# Patient Record
Sex: Female | Born: 1950 | Race: White | Hispanic: No | Smoking: Current every day smoker
Health system: Southern US, Community
[De-identification: ages and names within clinical notes are randomized; demographics above are authoritative.]

## PROBLEM LIST (undated history)

## (undated) DIAGNOSIS — R51 Headache: Secondary | ICD-10-CM

## (undated) DIAGNOSIS — I10 Essential (primary) hypertension: Secondary | ICD-10-CM

## (undated) DIAGNOSIS — Z8489 Family history of other specified conditions: Secondary | ICD-10-CM

## (undated) DIAGNOSIS — R519 Headache, unspecified: Secondary | ICD-10-CM

## (undated) DIAGNOSIS — E119 Type 2 diabetes mellitus without complications: Secondary | ICD-10-CM

## (undated) DIAGNOSIS — E785 Hyperlipidemia, unspecified: Secondary | ICD-10-CM

## (undated) HISTORY — DX: Essential (primary) hypertension: I10

## (undated) HISTORY — DX: Type 2 diabetes mellitus without complications: E11.9

## (undated) HISTORY — PX: TONSILLECTOMY: SUR1361

## (undated) HISTORY — PX: WISDOM TOOTH EXTRACTION: SHX21

## (undated) HISTORY — DX: Hyperlipidemia, unspecified: E78.5

## (undated) HISTORY — PX: APPENDECTOMY: SHX54

## (undated) HISTORY — PX: TONSILECTOMY, ADENOIDECTOMY, BILATERAL MYRINGOTOMY AND TUBES: SHX2538

---

## 2012-10-27 ENCOUNTER — Ambulatory Visit: Payer: Self-pay

## 2014-03-09 DIAGNOSIS — E119 Type 2 diabetes mellitus without complications: Secondary | ICD-10-CM | POA: Insufficient documentation

## 2014-03-09 DIAGNOSIS — I10 Essential (primary) hypertension: Secondary | ICD-10-CM | POA: Insufficient documentation

## 2014-03-09 DIAGNOSIS — E7800 Pure hypercholesterolemia, unspecified: Secondary | ICD-10-CM | POA: Insufficient documentation

## 2014-06-15 DIAGNOSIS — M545 Low back pain, unspecified: Secondary | ICD-10-CM | POA: Insufficient documentation

## 2015-06-21 DIAGNOSIS — E119 Type 2 diabetes mellitus without complications: Secondary | ICD-10-CM | POA: Insufficient documentation

## 2016-08-26 ENCOUNTER — Other Ambulatory Visit: Payer: Self-pay

## 2016-08-27 ENCOUNTER — Other Ambulatory Visit
Admission: RE | Admit: 2016-08-27 | Discharge: 2016-08-27 | Disposition: A | Discharge status: Home / Self Care | Payer: BLUE CROSS/BLUE SHIELD | Source: Ambulatory Visit | Attending: General Surgery | Admitting: General Surgery

## 2016-08-27 ENCOUNTER — Ambulatory Visit (INDEPENDENT_AMBULATORY_CARE_PROVIDER_SITE_OTHER): Payer: BLUE CROSS/BLUE SHIELD | Admitting: General Surgery

## 2016-08-27 ENCOUNTER — Encounter: Payer: Self-pay | Admitting: General Surgery

## 2016-08-27 ENCOUNTER — Telehealth: Payer: Self-pay | Admitting: General Surgery

## 2016-08-27 VITALS — BP 164/88 | HR 86 | Temp 98.0°F | Ht 65.0 in | Wt 206.0 lb

## 2016-08-27 DIAGNOSIS — R221 Localized swelling, mass and lump, neck: Secondary | ICD-10-CM | POA: Diagnosis present

## 2016-08-27 LAB — BASIC METABOLIC PANEL
Anion gap: 10 (ref 5–15)
BUN: 10 mg/dL (ref 6–20)
CALCIUM: 9.6 mg/dL (ref 8.9–10.3)
CO2: 24 mmol/L (ref 22–32)
CREATININE: 0.67 mg/dL (ref 0.44–1.00)
Chloride: 95 mmol/L — ABNORMAL LOW (ref 101–111)
GFR calc non Af Amer: >60 mL/min (ref >60)
GLUCOSE: 147 mg/dL — AB (ref 65–99)
Potassium: 4.1 mmol/L (ref 3.5–5.1)
Sodium: 129 mmol/L — ABNORMAL LOW (ref 135–145)

## 2016-08-27 NOTE — Patient Instructions (Signed)
We need for you to go to the lab on 08/31/16 at 1:00 pm. Your CT scan is scheduled on 08/31/16 at 1:15 pm At [facility] office. Please do not have any thing by mouth 4 hours prior to this CT scan. Please call our office if you have any questions. We will send a referral to Dr. Charolett Bumpers office and someone will contact you from there office.

## 2016-08-27 NOTE — Progress Notes (Signed)
Patient ID: Angela Ballard, female   DOB: 09/04/1951, 65 y.o.   MRN: [id number]  CC: Neck Mass  HPI Angela Ballard is a 65 y.o. female who presents to clinic today for evaluation of a left neck mass. It was thought by her primary care provider to be a lymph node but has persisted for many months so a surgical consultation was requested. Patient reports that she thinks the area might be a little bit smaller than it was at its peak but it persists. Is been there at least 2 months. She denies any pain to the area. She denies any other actual symptoms. She denies any fevers, chills, nausea, vomiting, chest pain, shortness of breath, diarrhea, constipation. She also denies any pain with swallowing or any hoarseness. The vast majority of her teeth or guarding removed secondary to dental infections but she states she's had no new problems or difficulty with her dentures.  HPI  Past Medical History:  Diagnosis Date  . Diabetes mellitus without complication ([place])   . Hyperlipidemia   . Hypertension     Past Surgical History:  Procedure Laterality Date  . APPENDECTOMY    . TONSILECTOMY, ADENOIDECTOMY, BILATERAL MYRINGOTOMY AND TUBES  child  . WISDOM TOOTH EXTRACTION  teenager    Family History  Problem Relation Age of Onset  . Breast cancer Mother   . Peripheral Artery Disease Sister   . Epilepsy Brother     Social History Social History  Substance Use Topics  . Smoking status: Current Every Day Smoker    Packs/day: 1.00    Years: 40.00  . Smokeless tobacco: Never Used  . Alcohol use Yes     Comment: 1 beer daily    Allergies  Allergen Reactions  . Pravastatin Other (See Comments)  . Penicillins Rash    Current Outpatient Prescriptions  Medication Sig Dispense Refill  . Barberry-Oreg Grape-Goldenseal (BERBERINE COMPLEX) 200-200-50 MG CAPS Take by mouth.    . Biotin 1 MG CAPS Take by mouth.    . Cinnamon 500 MG capsule Take by mouth.    . Garlic 10 MG CAPS Take by mouth.     Marland Kitchen lisinopril-hydrochlorothiazide (PRINZIDE,ZESTORETIC) 10-12.5 MG tablet Take by mouth.    . metFORMIN (GLUCOPHAGE) 500 MG tablet Take by mouth.    . Multiple Vitamin (MULTI-VITAMINS) TABS Take by mouth.    . Omega-3 Fatty Acids (FISH OIL) 1000 MG CAPS Take by mouth.    . Red Yeast Rice 500 MG/0.5GM POWD Take by mouth.     No current facility-administered medications for this visit.      Review of Systems A Multi-point review of systems was asked and was negative except for the findings documented in the history of present illness   Physical Exam Blood pressure (!) 164/88, pulse 86, temperature 98 F (36.7 C), temperature source Oral, height 5\' 5"  (1.651 m), weight 93.4 kg (206 lb). CONSTITUTIONAL: No acute distress. EYES: Pupils are equal, round, and reactive to light, Sclera are non-icteric. EARS, NOSE, MOUTH AND THROAT: The oropharynx is clear. The oral mucosa is pink and moist. Hearing is intact to voice. Patient only has one natural tooth remaining remainder teeth are dentures. There is no palpable lesion within the mouth or along the gumline. NECK: Upon palpation of the patient's neck there is a 2 x 3 cm soft tissue mass, coming off of the angle of the mandible on the left. It is soft and partially mobile but there is no obvious delineation between the  soft tissue mass and the bottom of the parotid gland. There is no obvious lesion anywhere else on the neck and there are no obvious lymph nodes separate from this mass. RESPIRATORY:  Lungs are clear. There is normal respiratory effort, with equal breath sounds bilaterally, and without pathologic use of accessory muscles. CARDIOVASCULAR: Heart is regular without murmurs, gallops, or rubs. GI: The abdomen is soft, nontender, and nondistended. There are no palpable masses. There is no hepatosplenomegaly. There are normal bowel sounds in all quadrants. GU: Rectal deferred.   MUSCULOSKELETAL: Normal muscle strength and tone. No cyanosis or  edema.   SKIN: Turgor is good and there are no pathologic skin lesions or ulcers. NEUROLOGIC: Motor and sensation is grossly normal. Cranial nerves are grossly intact. PSYCH:  Oriented to person, place and time. Affect is normal.  Data Reviewed There are no images were labs to review I have personally reviewed the patient's imaging, laboratory findings and medical records.    Assessment    Left neck mass    Plan    65 year old female with a significant smoking history presents with a nontender left neck mass. Patient thinks that this may be related to an infection but there are no other signs of an infection. Given its size and location discussed with patient that it may be coming off of her parotid gland or it might be a lymph node. Discussed that we would obtain a CT scan of the head and neck as an outpatient and that I would have her follow up with an ENT surgeon for further evaluation. Discussed that should this be a problem that require surgery she be better suited with a surgeon who operates in the neck routinely. Outpatient orders for CT scan and referral to ENT were placed today. She'll follow-up in the general surgery clinic on an as-needed basis.     Time spent with the patient was 45 minutes, with more than 50% of the time spent in face-to-face education, counseling and care coordination.     Clayburn Pert, MD FACS General Surgeon 08/27/2016, 10:21 AM

## 2016-08-27 NOTE — Telephone Encounter (Signed)
Patient was referred to Dr Kathyrn Sheriff ENT. Appointment has been made. Patient has been advised of appointment information below.   09/07/16 @ 1:45--arrive 15 minutes early. [place] Location [address] [place] [address]  All clinic notes have been faxed to [phone].

## 2016-08-31 ENCOUNTER — Ambulatory Visit
Admission: RE | Admit: 2016-08-31 | Discharge: 2016-08-31 | Disposition: A | Discharge status: Home / Self Care | Payer: BLUE CROSS/BLUE SHIELD | Source: Ambulatory Visit | Attending: General Surgery | Admitting: General Surgery

## 2016-08-31 ENCOUNTER — Telehealth: Payer: Self-pay | Admitting: General Surgery

## 2016-08-31 ENCOUNTER — Encounter: Payer: Self-pay | Admitting: Radiology

## 2016-08-31 DIAGNOSIS — K119 Disease of salivary gland, unspecified: Secondary | ICD-10-CM | POA: Diagnosis not present

## 2016-08-31 DIAGNOSIS — R221 Localized swelling, mass and lump, neck: Secondary | ICD-10-CM | POA: Diagnosis present

## 2016-08-31 DIAGNOSIS — G319 Degenerative disease of nervous system, unspecified: Secondary | ICD-10-CM | POA: Diagnosis not present

## 2016-08-31 MED ORDER — IOPAMIDOL (ISOVUE-370) INJECTION 76%
75.0000 mL | Freq: Once | INTRAVENOUS | Status: AC | PRN
  Administered 2016-08-31: 75 mL via INTRAVENOUS

## 2016-08-31 NOTE — Telephone Encounter (Signed)
Patient would like to know results of CT scan from today. Report has not been read by radiologist at this time, how ever I told her I would check tomorrow and call her with the results. Patient has a follow up appointment with Dr.Juengal on 09/07/16.

## 2016-08-31 NOTE — Telephone Encounter (Signed)
Patient stated that a nurse here told her to call the office after she had her scan today. She wasn't sure why

## 2016-09-01 NOTE — Telephone Encounter (Signed)
LVM for patient to call office regarding Ct results.

## 2016-09-02 NOTE — Telephone Encounter (Signed)
LVM for patient to call office at this time.  CT scan faxed to [place] office at this time. Patient has an appointment on 09/07/16

## 2016-09-02 NOTE — Telephone Encounter (Signed)
Patient called to find out the results of her CT scan.

## 2016-09-03 NOTE — Telephone Encounter (Signed)
Patient has called and would like you to give her a call on her work phone @ [phone]. She would like to discuss her CT results. If patient does not answer, she would like you to leave a message on her voicemail and she will call back. She will be in and out of  Meetings today.

## 2016-09-03 NOTE — Telephone Encounter (Signed)
Spoke with patient at this time to discuss results of Ct scan. Patient has an appointment with ENT 09/07/16. Patient verbalized understanding.

## 2016-09-10 NOTE — Telephone Encounter (Signed)
Patient was seen in [place] office 09/07/16 and FNA was done. Patient has follow up appointment 09/11/16 to discuss the results.

## 2016-09-28 ENCOUNTER — Encounter
Admission: RE | Admit: 2016-09-28 | Discharge: 2016-09-28 | Disposition: A | Discharge status: Home / Self Care | Payer: BLUE CROSS/BLUE SHIELD | Source: Ambulatory Visit | Attending: Otolaryngology | Admitting: Otolaryngology

## 2016-09-28 HISTORY — DX: Family history of other specified conditions: Z84.89

## 2016-09-28 HISTORY — DX: Headache: R51

## 2016-09-28 HISTORY — DX: Headache, unspecified: R51.9

## 2016-09-28 NOTE — Patient Instructions (Signed)
  Your procedure is scheduled on: 10-05-16 Report to Same Day Surgery 2nd floor medical mall [facility] Entrance-take elevator on left to 2nd floor.  Check in with surgery information desk.) To find out your arrival time please call [phone] between 1PM - 3PM on 10-02-16  Remember: Instructions that are not followed completely may result in serious medical risk, up to and including death, or upon the discretion of your surgeon and anesthesiologist your surgery may need to be rescheduled.    _x___ 1. Do not eat food or drink liquids after midnight. No gum chewing or hard candies.     __x__ 2. No Alcohol for 24 hours before or after surgery.   __x__3. No Smoking for 24 prior to surgery.   ____  4. Bring all medications with you on the day of surgery if instructed.    __x__ 5. Notify your doctor if there is any change in your medical condition     (cold, fever, infections).     Do not wear jewelry, make-up, hairpins, clips or nail polish.  Do not wear lotions, powders, or perfumes. You may wear deodorant.  Do not shave 48 hours prior to surgery. Men may shave face and neck.  Do not bring valuables to the hospital.    [facility] is not responsible for any belongings or valuables.               Contacts, dentures or bridgework may not be worn into surgery.  Leave your suitcase in the car. After surgery it may be brought to your room.  For patients admitted to the hospital, discharge time is determined by your treatment team.   Patients discharged the day of surgery will not be allowed to drive home.  You will need someone to drive you home and stay with you the night of your procedure.    Please read over the following fact sheets that you were given:   [facility] Preparing for Surgery and or MRSA Information   ____ Take these medicines the morning of surgery with A SIP OF WATER:    1. NONE  2.  3.  4.  5.  6.  ____Fleets enema or Magnesium Citrate as directed.   ____  Use CHG Soap or sage wipes as directed on instruction sheet   ____ Use inhalers on the day of surgery and bring to hospital day of surgery  _X___ Stop metformin 2 days prior to surgery-LAST DOSE ON Friday, December 8TH    ____ Take 1/2 of usual insulin dose the night before surgery and none on the morning of  surgery.   ____ Stop Aspirin, Coumadin, Pllavix ,Eliquis, Effient, or Pradaxa  x__ Stop Anti-inflammatories such as Advil, Aleve, Ibuprofen, Motrin, Naproxen,          Naprosyn, Goodies powders or aspirin products NOW- Ok to take Tylenol.   _X___ Stop supplements until after surgery-STOP BIOTIN, TURMERIC, CINNAMON, FISH OIL, BERBERINE, RED YEAST RICE AND GARLIC  ____ Bring C-Pap to the hospital.
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
## 2016-10-02 NOTE — Pre-Procedure Instructions (Signed)
Cardiac clearance note on chart from Dr Clayborn Bigness on 10-01-16-Mild risk

## 2016-10-02 NOTE — Pre-Procedure Instructions (Signed)
NM myocardial perfusion SPECT multiple (stress and rest)09/30/2016 [place] Result Impression   Normal myocardial perfusion scan no evidence of stress-induced  micro-ischemia ejection fraction of 67% conclusion negative scan  Result Narrative  CARDIOLOGY DEPARTMENT [facility] [address] [place] [id number] [phone]  Procedure: Exercise Myocardial Perfusion Imaging ONE day procedure  Indication: Pre-operative clearance Plan: NM myocardial perfusion SPECT multiple (stress  and rest), ECG stress test only  Abnormal ECG Plan: NM myocardial perfusion SPECT multiple (stress  and rest), ECG stress test only  Ordering Physician:   Dr. Lujean Amel   Clinical History: 65 y.o. year old female recent anginal symptoms Vitals: Height: 67 inWeight: 203 lb Cardiac risk factors include:  Smoking, Hyperlipidemia, Diabetes and HTN    Procedure: The patient performed treadmill exercise using a Bruce protocol for 4:15  minutes. The exercise test was stopped due to fatigue.Blood pressure  response was normal. The patient developed symptoms other than fatigue  during the procedure; specific symptoms included SOB  Rest HR: 75bpm Rest BP: 136/21mmHg Max HR: 144bpm Max BP: 192/65mmHg Mets: 7.00 % MAX HR: 92%  Stress Test Administered by: Oswald Hillock, CMA  ECG Interpretation: Rest [id number] sinus rhythm, none Stress [id number] tachycardia,  Recovery [id number] sinus rhythm ECG Interpretation:negative, nondiagnostic changes.   Administrations This Visit  technetium Tc37m sestamibi (CARDIOLITE) injection 10 millicurie  Admin Date Q000111Q Action Given Dose 0000000 millicurie Route Intravenous Administered By Hollie Salk, CNMT     technetium Tc22m sestamibi (CARDIOLITE) injection [id number] millicurie  Admin Date Q000111Q Action Given Dose [id number] millicurie  Route Intravenous Administered By Hollie Salk, CNMT       Gated post-stress perfusion imaging was performed 30 minutes after stress.  Rest images were performed 30 minutes after injection.  Gated LV Analysis:  TID:0.9  LVEF= 67 %  FINDINGS: Regional wall motion:reveals normal myocardial thickening and wall  motion. The overall quality of the study is good. Artifacts noted: no Left ventricular cavity: normal.  Perfusion Analysis:SPECT images demonstrate homogeneous tracer  distribution throughout the myocardium.  Status Results Details    Appointment on 09/30/2016 [place]")' [email or web address]/">Encounter Summary

## 2016-10-02 NOTE — Pre-Procedure Instructions (Signed)
Echo complete12/03/2016 [facility] Component Name Value Ref Range  LV Ejection Fraction (%) 55   Aortic Valve Stenosis Grade none   Aortic Valve Regurgitation Grade none   Aortic Valve Max Velocity (m/s) 1.6 m/sec   Aortic Valve Stenosis Mean Gradient (mmHg) 5.0 mmHg   Mitral Valve Regurgitation Grade trivial   Mitral Valve Stenosis Grade none   Tricuspid Valve Regurgitation Grade trivial   Tricuspid Valve Regurgitation Max Velocity (m/s) 2.2 m/sec   Right Ventricle Systolic Pressure (mmHg) 123XX123 mmHg   LV End Diastolic Diameter (cm) 4.9 cm  LV End Systolic Diameter (cm) 3.1 cm  LV Septum Wall Thickness (cm) 1.0 cm  LV Posterior Wall Thickness (cm) 1.1 cm  Left Atrium Diameter (cm) 3.9 cm  Result Narrative  CARDIOLOGY Angela Ballard, [facility] [id number]  [facility]: [id number]  [address] [place], [place] [address]Date: 09/30/2016 08:34 AM  Adult Female Age: 65 yrs ECHOCARDIOGRAM REPORTOutpatient  KC::KCWC STUDY:CHEST WALL TAPE:0000:00: 0:00:00 MD1:CALLWOOD, DWAYNE DENNIS  ECHO:Yes DOPPLER:YesFILE:0000-000-000 BP: 132/70 mmHg COLOR:YesCONTRAST:No MACHINE:[place] Height: 67 in RV BIOPSY:No 3D:NoSOUND QLTY:ModerateWeight: 203 lb  MEDIUM:None BSA: 2.0 m2  ___________________________________________________________________________________________  HISTORY:ECG abnormality  Pre-op cardiovascular examination REASON:Assess, LV function INDICATION:Z01.818 Pre-operative clearance, R94.31 Abnormal electrocardiogram  [ECG]  [EKG] ___________________________________________________________________________________________  ECHOCARDIOGRAPHIC MEASUREMENTS 2D DIMENSIONS AORTA ValuesNormal RangeMAIN PAValuesNormal Range Annulus:nm* [2.1 - 2.5]PA Main:nm* [1.5 - 2.1] Aorta Sin:nm* [2.7 - 3.3] RIGHT VENTRICLE ST Junction:nm* [2.3 - 2.9]RV Base:3.5 cm[ < 4.2] Asc.Aorta:nm* [2.3 - 3.1] RV Mid:nm* [ < 3.5]  LEFT VENTRICLERV Length:nm* [ < 8.6] LVIDd:4.9 cm[3.9 - 5.3] INFERIOR VENA CAVA LVIDs:3.1 cmMax. IVC:nm* [ <= 2.1]  FS:36.7 %[> 25]Min. IVC:nm* SWT:1.0 cm[0.5 - 0.9] ------------------ PWT:1.1 cm[0.5 - 0.9] nm* - not measured  LEFT ATRIUM LA Diam:3.9 cm[2.7 - 3.8] LA A4C Area:nm* [ < 20] LA Volume:nm* [22 - 52] ___________________________________________________________________________________________  ECHOCARDIOGRAPHIC DESCRIPTIONS  AORTIC ROOT Size:Normal Dissection:INDETERM FOR DISSECTION  AORTIC VALVE Leaflets:Tricuspid Morphology:Normal Mobility:Fully mobile  LEFT VENTRICLE Size:NormalAnterior:Normal  Contraction:Normal Lateral:Normal Closest EF:>55% (Estimated)Septal:Normal  LV Masses:No Masses Apical:Normal  EB:[id number] Posterior:Normal Dias.FxClass:N/A  MITRAL VALVE Leaflets:NormalMobility:Fully mobile Morphology:Normal  LEFT  ATRIUM Size:Normal LA Masses:No masses  IA Septum:Normal IAS  MAIN PA Size:Normal  PULMONIC VALVE Morphology:NormalMobility:Fully mobile  RIGHT VENTRICLE  RV Masses:No Masses Size:Normal  Free Wall:Normal Contraction:Normal  TRICUSPID VALVE Leaflets:NormalMobility:Fully mobile Morphology:Normal  RIGHT ATRIUM Size:NormalRA Other:None  RA Mass:No masses  PERICARDIUM  Fluid:No effusion  INFERIOR VENACAVA Size:Normal Normal respiratory collapse  ____________________________________________________________________  DOPPLER ECHO and OTHER SPECIAL PROCEDURES  Aortic:No AR No AS 162.0 cm/sec peak vel 10.5 mmHg peak grad 5.0 mmHg mean grad2.5 cm^2 by DOPPLER   Mitral:TRIVIAL MRNo MS 3.3 cm^2 by DOPPLER MV Inflow E Vel=81.9 cm/sec MV Annulus E'Vel=8.0 cm/sec E/E'Ratio=10.2  Tricuspid:TRIVIAL TRNo TS 215.0 cm/sec peak TR vel23.5 mmHg peak RV pressure  Pulmonary:TRIVIAL PRNo PS    ___________________________________________________________________________________________  INTERPRETATION NORMAL LEFT VENTRICULAR SYSTOLIC FUNCTION WITH AN ESTIMATED EF = >55 % NORMAL RIGHT VENTRICULAR SYSTOLIC FUNCTION TRIVIAL REGURGITATION NOTED (See above) NO VALVULAR STENOSIS  ___________________________________________________________________________________________  Electronically signed by: Lujean Amel, MD on 10/01/2016 12:49 PM Performed By: Johnathan Hausen, RDCS, RVT Ordering Physician: Lujean Amel ___________________________________________________________________________________________  Status Results Details     Appointment on 09/30/2016 [place]")' [email or web address] Summary  Date Index Date Index 2017 2017 December Result Index Result Index Echo complete 09/30/2016 NM myocardial perfusion SPECT multiple (stress and rest) NM myocardial perfusion SPECT multiple (stress and rest) 09/30/2016

## 2016-10-05 ENCOUNTER — Encounter
Admission: RE | Disposition: A | Discharge status: Home / Self Care | Payer: Self-pay | Source: Ambulatory Visit | Attending: Otolaryngology

## 2016-10-05 ENCOUNTER — Ambulatory Visit: Payer: BLUE CROSS/BLUE SHIELD | Admitting: Anesthesiology

## 2016-10-05 ENCOUNTER — Observation Stay
Admission: RE | Admit: 2016-10-05 | Discharge: 2016-10-06 | Disposition: A | Discharge status: Home / Self Care | Payer: BLUE CROSS/BLUE SHIELD | Source: Ambulatory Visit | Attending: Otolaryngology | Admitting: Otolaryngology

## 2016-10-05 DIAGNOSIS — Z880 Allergy status to penicillin: Secondary | ICD-10-CM | POA: Insufficient documentation

## 2016-10-05 DIAGNOSIS — Z888 Allergy status to other drugs, medicaments and biological substances status: Secondary | ICD-10-CM | POA: Diagnosis not present

## 2016-10-05 DIAGNOSIS — E119 Type 2 diabetes mellitus without complications: Secondary | ICD-10-CM | POA: Insufficient documentation

## 2016-10-05 DIAGNOSIS — Z886 Allergy status to analgesic agent status: Secondary | ICD-10-CM | POA: Insufficient documentation

## 2016-10-05 DIAGNOSIS — D490 Neoplasm of unspecified behavior of digestive system: Secondary | ICD-10-CM | POA: Diagnosis present

## 2016-10-05 DIAGNOSIS — F17210 Nicotine dependence, cigarettes, uncomplicated: Secondary | ICD-10-CM | POA: Insufficient documentation

## 2016-10-05 DIAGNOSIS — D110 Benign neoplasm of parotid gland: Principal | ICD-10-CM | POA: Insufficient documentation

## 2016-10-05 DIAGNOSIS — Z7984 Long term (current) use of oral hypoglycemic drugs: Secondary | ICD-10-CM | POA: Diagnosis not present

## 2016-10-05 DIAGNOSIS — R29810 Facial weakness: Secondary | ICD-10-CM | POA: Diagnosis not present

## 2016-10-05 DIAGNOSIS — Z23 Encounter for immunization: Secondary | ICD-10-CM | POA: Insufficient documentation

## 2016-10-05 DIAGNOSIS — Z6834 Body mass index (BMI) 34.0-34.9, adult: Secondary | ICD-10-CM | POA: Insufficient documentation

## 2016-10-05 DIAGNOSIS — I10 Essential (primary) hypertension: Secondary | ICD-10-CM | POA: Diagnosis not present

## 2016-10-05 DIAGNOSIS — E669 Obesity, unspecified: Secondary | ICD-10-CM | POA: Insufficient documentation

## 2016-10-05 HISTORY — PX: PAROTIDECTOMY: SHX2163

## 2016-10-05 LAB — GLUCOSE, CAPILLARY
Glucose-Capillary: 193 mg/dL — ABNORMAL HIGH (ref 65–99)
Glucose-Capillary: 209 mg/dL — ABNORMAL HIGH (ref 65–99)
Glucose-Capillary: 175 mg/dL — ABNORMAL HIGH (ref 65–99)

## 2016-10-05 SURGERY — EXCISION, PAROTID GLAND
Anesthesia: General | Laterality: Left

## 2016-10-05 MED ORDER — HYDROCODONE-ACETAMINOPHEN 5-325 MG PO TABS
1.0000 | ORAL_TABLET | ORAL | Status: DC | PRN
  Administered 2016-10-05 – 2016-10-06 (×5): 2 via ORAL
  Filled 2016-10-05 (×5): qty 2

## 2016-10-05 MED ORDER — ONDANSETRON HCL 4 MG/2ML IJ SOLN: 4.0000 mg | Freq: Four times a day (QID) | INTRAMUSCULAR | Status: DC | PRN

## 2016-10-05 MED ORDER — FENTANYL CITRATE (PF) 100 MCG/2ML IJ SOLN
25.0000 ug | INTRAMUSCULAR | Status: DC | PRN
  Administered 2016-10-05 (×4): 25 ug via INTRAVENOUS

## 2016-10-05 MED ORDER — DEXAMETHASONE SODIUM PHOSPHATE 10 MG/ML IJ SOLN
INTRAMUSCULAR | Status: DC | PRN
  Administered 2016-10-05: 4 mg via INTRAVENOUS

## 2016-10-05 MED ORDER — FLEET ENEMA 7-19 GM/118ML RE ENEM: 1.0000 | ENEMA | Freq: Once | RECTAL | Status: DC | PRN

## 2016-10-05 MED ORDER — BACITRACIN 500 UNIT/GM EX OINT
TOPICAL_OINTMENT | CUTANEOUS | Status: DC | PRN
  Administered 2016-10-05: 1 via TOPICAL

## 2016-10-05 MED ORDER — LIDOCAINE-EPINEPHRINE 1 %-1:100000 IJ SOLN
INTRAMUSCULAR | Status: DC | PRN
  Administered 2016-10-05: 10 mL

## 2016-10-05 MED ORDER — MEPERIDINE HCL 25 MG/ML IJ SOLN: 6.2500 mg | INTRAMUSCULAR | Status: DC | PRN

## 2016-10-05 MED ORDER — LIDOCAINE-EPINEPHRINE 1 %-1:100000 IJ SOLN
INTRAMUSCULAR | Status: AC
  Filled 2016-10-05: qty 1

## 2016-10-05 MED ORDER — FENTANYL CITRATE (PF) 100 MCG/2ML IJ SOLN
INTRAMUSCULAR | Status: DC | PRN
  Administered 2016-10-05: 25 ug via INTRAVENOUS
  Administered 2016-10-05 (×3): 50 ug via INTRAVENOUS
  Administered 2016-10-05: 25 ug via INTRAVENOUS
  Administered 2016-10-05: 50 ug via INTRAVENOUS
  Administered 2016-10-05: 100 ug via INTRAVENOUS
  Administered 2016-10-05: 50 ug via INTRAVENOUS

## 2016-10-05 MED ORDER — LISINOPRIL 10 MG PO TABS
10.0000 mg | ORAL_TABLET | Freq: Every day | ORAL | Status: DC
  Administered 2016-10-05: 10 mg via ORAL
  Filled 2016-10-05: qty 1

## 2016-10-05 MED ORDER — LISINOPRIL-HYDROCHLOROTHIAZIDE 10-12.5 MG PO TABS: 1.0000 | ORAL_TABLET | Freq: Every day | ORAL | Status: DC

## 2016-10-05 MED ORDER — FAMOTIDINE 20 MG PO TABS
20.0000 mg | ORAL_TABLET | Freq: Once | ORAL | Status: AC
  Administered 2016-10-05: 20 mg via ORAL

## 2016-10-05 MED ORDER — PROMETHAZINE HCL 25 MG/ML IJ SOLN: 6.2500 mg | INTRAMUSCULAR | Status: DC | PRN

## 2016-10-05 MED ORDER — OXYCODONE HCL 5 MG/5ML PO SOLN: 5.0000 mg | Freq: Once | ORAL | Status: DC | PRN

## 2016-10-05 MED ORDER — PNEUMOCOCCAL VAC POLYVALENT 25 MCG/0.5ML IJ INJ
0.5000 mL | INJECTION | INTRAMUSCULAR | Status: AC
  Administered 2016-10-06: 0.5 mL via INTRAMUSCULAR
  Filled 2016-10-05: qty 0.5

## 2016-10-05 MED ORDER — DEXTROSE-NACL 5-0.45 % IV SOLN
INTRAVENOUS | Status: DC
  Administered 2016-10-05 – 2016-10-06 (×2): via INTRAVENOUS

## 2016-10-05 MED ORDER — HYDROCHLOROTHIAZIDE 12.5 MG PO CAPS
12.5000 mg | ORAL_CAPSULE | Freq: Every day | ORAL | Status: DC
  Administered 2016-10-05: 12.5 mg via ORAL
  Filled 2016-10-05 (×2): qty 1

## 2016-10-05 MED ORDER — INSULIN ASPART 100 UNIT/ML ~~LOC~~ SOLN: 0.0000 [IU] | Freq: Three times a day (TID) | SUBCUTANEOUS | Status: DC

## 2016-10-05 MED ORDER — ACETAMINOPHEN 650 MG RE SUPP: 650.0000 mg | Freq: Four times a day (QID) | RECTAL | Status: DC | PRN

## 2016-10-05 MED ORDER — PROPOFOL 10 MG/ML IV BOLUS
INTRAVENOUS | Status: DC | PRN
  Administered 2016-10-05: 150 mg via INTRAVENOUS
  Administered 2016-10-05: 50 mg via INTRAVENOUS

## 2016-10-05 MED ORDER — LIDOCAINE HCL (PF) 1 % IJ SOLN
INTRAMUSCULAR | Status: AC
  Filled 2016-10-05: qty 30

## 2016-10-05 MED ORDER — EPHEDRINE SULFATE 50 MG/ML IJ SOLN
INTRAMUSCULAR | Status: DC | PRN
  Administered 2016-10-05: 10 mg via INTRAVENOUS

## 2016-10-05 MED ORDER — METFORMIN HCL 500 MG PO TABS
500.0000 mg | ORAL_TABLET | Freq: Two times a day (BID) | ORAL | Status: DC
  Administered 2016-10-05 – 2016-10-06 (×2): 500 mg via ORAL
  Filled 2016-10-05 (×3): qty 1

## 2016-10-05 MED ORDER — FAMOTIDINE 20 MG PO TABS
ORAL_TABLET | ORAL | Status: AC
  Administered 2016-10-05: 20 mg via ORAL
  Filled 2016-10-05: qty 1

## 2016-10-05 MED ORDER — BISACODYL 5 MG PO TBEC: 5.0000 mg | DELAYED_RELEASE_TABLET | Freq: Every day | ORAL | Status: DC | PRN

## 2016-10-05 MED ORDER — SUCCINYLCHOLINE CHLORIDE 20 MG/ML IJ SOLN
INTRAMUSCULAR | Status: DC | PRN
  Administered 2016-10-05: 100 mg via INTRAVENOUS

## 2016-10-05 MED ORDER — BACITRACIN ZINC 500 UNIT/GM EX OINT
TOPICAL_OINTMENT | CUTANEOUS | Status: AC
  Filled 2016-10-05: qty 28.35

## 2016-10-05 MED ORDER — ONDANSETRON HCL 4 MG/2ML IJ SOLN
INTRAMUSCULAR | Status: DC | PRN
  Administered 2016-10-05: 4 mg via INTRAVENOUS

## 2016-10-05 MED ORDER — SODIUM CHLORIDE 0.9 % IV SOLN
INTRAVENOUS | Status: DC
  Administered 2016-10-05: 1000 mL via INTRAVENOUS

## 2016-10-05 MED ORDER — INSULIN ASPART 100 UNIT/ML ~~LOC~~ SOLN: 0.0000 [IU] | Freq: Every day | SUBCUTANEOUS | Status: DC

## 2016-10-05 MED ORDER — LIDOCAINE HCL (CARDIAC) 20 MG/ML IV SOLN
INTRAVENOUS | Status: DC | PRN
  Administered 2016-10-05: 40 mg via INTRAVENOUS

## 2016-10-05 MED ORDER — OXYCODONE HCL 5 MG PO TABS: 5.0000 mg | ORAL_TABLET | Freq: Once | ORAL | Status: DC | PRN

## 2016-10-05 MED ORDER — PHENYLEPHRINE HCL 10 MG/ML IJ SOLN
INTRAMUSCULAR | Status: DC | PRN
  Administered 2016-10-05: 40 ug/min via INTRAVENOUS

## 2016-10-05 MED ORDER — ONDANSETRON 4 MG PO TBDP: 4.0000 mg | ORAL_TABLET | Freq: Four times a day (QID) | ORAL | Status: DC | PRN

## 2016-10-05 MED ORDER — MAGNESIUM HYDROXIDE 400 MG/5ML PO SUSP: 30.0000 mL | Freq: Every day | ORAL | Status: DC | PRN

## 2016-10-05 MED ORDER — ACETAMINOPHEN 325 MG PO TABS: 650.0000 mg | ORAL_TABLET | Freq: Four times a day (QID) | ORAL | Status: DC | PRN

## 2016-10-05 MED ORDER — MORPHINE SULFATE (PF) 4 MG/ML IV SOLN
3.0000 mg | INTRAVENOUS | Status: DC | PRN
  Administered 2016-10-05 (×2): 3 mg via INTRAVENOUS
  Filled 2016-10-05 (×3): qty 1

## 2016-10-05 MED ORDER — PHENYLEPHRINE HCL 10 MG/ML IJ SOLN
INTRAMUSCULAR | Status: DC | PRN
  Administered 2016-10-05 (×4): 100 ug via INTRAVENOUS

## 2016-10-05 MED ORDER — FENTANYL CITRATE (PF) 100 MCG/2ML IJ SOLN
INTRAMUSCULAR | Status: AC
  Administered 2016-10-05: 11:00:00
  Filled 2016-10-05: qty 2

## 2016-10-05 SURGICAL SUPPLY — 41 items
BLADE SURG 15 STRL LF DISP TIS (BLADE) ×1 IMPLANT
BLADE SURG 15 STRL SS (BLADE) ×2
CORD BIP STRL DISP 12FT (MISCELLANEOUS) ×3 IMPLANT
DRAIN TLS ROUND 10FR (DRAIN) ×3 IMPLANT
DRAPE INCISE 23X17 IOBAN STRL (DRAPES) ×4
DRAPE INCISE IOBAN 23X17 STRL (DRAPES) ×2 IMPLANT
DRAPE MAG INST 16X20 L/F (DRAPES) ×3 IMPLANT
DRESSING TELFA 4X3 1S ST N-ADH (GAUZE/BANDAGES/DRESSINGS) ×3 IMPLANT
DRSG TEGADERM 2-3/8X2-3/4 SM (GAUZE/BANDAGES/DRESSINGS) ×12 IMPLANT
DRSG TEGADERM 4X4.75 (GAUZE/BANDAGES/DRESSINGS) IMPLANT
ELECT CAUTERY BLADE TIP 2.5 (TIP)
ELECT EMG 20MM DUAL (MISCELLANEOUS) ×6
ELECT NEEDLE 20X.3 GREEN (MISCELLANEOUS) ×3
ELECT REM PT RETURN 9FT ADLT (ELECTROSURGICAL) ×3
ELECTRODE CAUTERY BLDE TIP 2.5 (TIP) IMPLANT
ELECTRODE EMG 20MM DUAL (MISCELLANEOUS) ×2 IMPLANT
ELECTRODE NEEDLE 20X.3 GREEN (MISCELLANEOUS) ×1 IMPLANT
ELECTRODE REM PT RTRN 9FT ADLT (ELECTROSURGICAL) ×1 IMPLANT
FORCEPS JEWEL BIP 4-3/4 STR (INSTRUMENTS) ×3 IMPLANT
GLOVE BIO SURGEON STRL SZ7.5 (GLOVE) ×3 IMPLANT
GLOVE SURG SYN 7.5  E (GLOVE) ×2
GLOVE SURG SYN 7.5 E (GLOVE) ×1 IMPLANT
GOWN STRL REUS W/ TWL LRG LVL3 (GOWN DISPOSABLE) ×3 IMPLANT
GOWN STRL REUS W/TWL LRG LVL3 (GOWN DISPOSABLE) ×6
HARMONIC SCALPEL FOCUS (MISCELLANEOUS) ×3 IMPLANT
HOOK STAY BLUNT/RETRACTOR 5M (MISCELLANEOUS) ×3 IMPLANT
LABEL OR SOLS (LABEL) ×3 IMPLANT
PACK HEAD/NECK (MISCELLANEOUS) ×3 IMPLANT
PROBE MONO 100X0.75 ELECT 1.9M (MISCELLANEOUS) ×3 IMPLANT
PROBE NEUROSIGN BIPOL (MISCELLANEOUS) IMPLANT
PROBE NEUROSIGN BIPOLAR (MISCELLANEOUS)
SPONGE KITTNER 5P (MISCELLANEOUS) ×9 IMPLANT
SPONGE XRAY 4X4 16PLY STRL (MISCELLANEOUS) ×6 IMPLANT
SUT ETHILON 6 0 9-3 1X18 BLK (SUTURE) IMPLANT
SUT MERSILENE 4-0 WHT RB-1 (SUTURE) ×6 IMPLANT
SUT PROLENE 6 0 PC 1 (SUTURE) IMPLANT
SUT SILK 2 0 (SUTURE) ×2
SUT SILK 2-0 18XBRD TIE 12 (SUTURE) ×1 IMPLANT
SUT SILK 3 0 REEL (SUTURE) IMPLANT
SUT VIC AB 4-0 RB1 18 (SUTURE) ×3 IMPLANT
SYSTEM CHEST DRAIN TLS 7FR (DRAIN) IMPLANT

## 2016-10-05 NOTE — H&P (Signed)
  H&P has been reviewed and no changes necessary. To be downloaded later. 

## 2016-10-05 NOTE — Anesthesia Procedure Notes (Addendum)
Procedure Name: Intubation Performed by: Lance Muss Pre-anesthesia Checklist: Patient identified, Patient being monitored, Timeout performed, Emergency Drugs available and Suction available Patient Re-evaluated:Patient Re-evaluated prior to inductionOxygen Delivery Method: Circle system utilized Preoxygenation: Pre-oxygenation with 100% oxygen Intubation Type: IV induction Ventilation: Mask ventilation without difficulty and Oral airway inserted - appropriate to patient size Laryngoscope Size: Mac and 3 Grade View: Grade I Tube type: Oral Tube size: 7.0 mm Number of attempts: 1 Airway Equipment and Method: Stylet and LTA kit utilized Placement Confirmation: ETT inserted through vocal cords under direct vision,  positive ETCO2 and breath sounds checked- equal and bilateral Secured at: 22 cm Tube secured with: Tape Dental Injury: Teeth and Oropharynx as per pre-operative assessment

## 2016-10-05 NOTE — Anesthesia Postprocedure Evaluation (Signed)
Anesthesia Post Note  Patient: Angela Ballard  Procedure(s) Performed: Procedure(s) (LRB): PAROTIDECTOMY (Left)  Patient location during evaluation: PACU Anesthesia Type: General Level of consciousness: awake and alert and oriented Pain management: pain level controlled Vital Signs Assessment: post-procedure vital signs reviewed and stable Respiratory status: spontaneous breathing, nonlabored ventilation and respiratory function stable Cardiovascular status: blood pressure returned to baseline and stable Postop Assessment: no signs of nausea or vomiting Anesthetic complications: no    Last Vitals:  Vitals:   10/05/16 1120 10/05/16 1148  BP: 123/67 (!) 144/47  Pulse: 83 80  Resp: 13 18  Temp: 36.2 C     Last Pain:  Vitals:   10/05/16 1120  TempSrc:   PainSc: 5                  Kaileena Obi

## 2016-10-05 NOTE — Transfer of Care (Signed)
Immediate Anesthesia Transfer of Care Note  Patient: Angela Ballard  Procedure(s) Performed: Procedure(s): PAROTIDECTOMY (Left)  Patient Location: PACU  Anesthesia Type:General  Level of Consciousness: awake and alert   Airway & Oxygen Therapy: Patient Spontanous Breathing and Patient connected to face mask oxygen  Post-op Assessment: Report given to RN and Post -op Vital signs reviewed and stable  Post vital signs: Reviewed and stable  Last Vitals:  Vitals:   10/05/16 1021 10/05/16 1025  BP: (!) 160/64 (!) 160/64  Pulse: 83 87  Resp: 20 15  Temp: 36.3 C     Last Pain:  Vitals:   10/05/16 0612  TempSrc: Oral         Complications: No apparent anesthesia complications

## 2016-10-05 NOTE — Anesthesia Preprocedure Evaluation (Signed)
Anesthesia Evaluation  Patient identified by MRN, date of birth, ID band Patient awake    Reviewed: Allergy & Precautions, NPO status , Patient's Chart, lab work & pertinent test results  History of Anesthesia Complications Negative for: history of anesthetic complications  Airway Mallampati: II  TM Distance: >3 FB Neck ROM: Full    Dental  (+) Missing, Poor Dentition   Pulmonary neg sleep apnea, neg COPD, Current Smoker,    breath sounds clear to auscultation- rhonchi (-) wheezing      Cardiovascular Exercise Tolerance: Good hypertension, Pt. on medications (-) CAD and (-) Past MI  Rhythm:Regular Rate:Normal - Systolic murmurs and - Diastolic murmurs    Neuro/Psych  Headaches, negative psych ROS   GI/Hepatic negative GI ROS, Neg liver ROS,   Endo/Other  diabetes, Type 2, Oral Hypoglycemic Agents  Renal/GU negative Renal ROS     Musculoskeletal negative musculoskeletal ROS (+)   Abdominal (+) + obese,   Peds  Hematology negative hematology ROS (+)   Anesthesia Other Findings Past Medical History: No date: Diabetes mellitus without complication (HCC) No date: Family history of adverse reaction to anesthes*     Comment: GRANDFATHER-PASSED AWAY DURING SURGERY-UNSURE No date: Headache     Comment: H/O No date: Hyperlipidemia No date: Hypertension   Reproductive/Obstetrics                             Anesthesia Physical Anesthesia Plan  ASA: III  Anesthesia Plan: General   Post-op Pain Management:    Induction: Intravenous  Airway Management Planned: Oral ETT  Additional Equipment:   Intra-op Plan:   Post-operative Plan: Extubation in OR  Informed Consent: I have reviewed the patients History and Physical, chart, labs and discussed the procedure including the risks, benefits and alternatives for the proposed anesthesia with the patient or authorized representative who has  indicated his/her understanding and acceptance.   Dental advisory given  Plan Discussed with: CRNA and Anesthesiologist  Anesthesia Plan Comments:         Anesthesia Quick Evaluation

## 2016-10-05 NOTE — Op Note (Signed)
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 A second tumor that was right down inferior to the root of the facial nerve as it exited the skull.  Procedure: The patient was brought to the operating room placed in a supine position. She had a shoulder roll placed had extended towards the right side. She was given general anesthesia by oral endotracheal intubation. He can see the wall of the left lateral side of the face into the neck area. Facial nerve monitoring was done by placing electrodes around the left orbital muscle and into the left perioral muscles. A skin incision line was marked starting preauricular in a skin crease extending around the earlobe and into the posterior neck just behind the mass in a skin crease. 10 mL of 1% Xylocaine with epi 1:100,000 was used for infiltration in the superficial skin of the left face and lateral neck. She was then prepped and draped in a sterile fashion.  Sedation was created where the skin markings were carried through subcutaneous down to the parotid fascia superiorly in the preauricular area and then to the muscular fascia posteriorly. Using double hooks the skin and subcutaneous was elevated anteriorly or approximately 5 cm. This allowed the skin to drape forward to expose the parotid fascia and the tumor. The fascia was lifted off of the cleidomastoid muscle to let forward and bring the tumor forward. The sternocleidomastoid muscle was followed underneath the left inferior parotid mass. The dissection was then  carried between the parotid and the cartilage of the ear into the groove down to the mastoid bone. This was carefully dissected and watched to make sure there is no movement of the face. The styloid process was found and the nerve was just inferior to the styloid process. It ran parallel to the styloid process. The deep tumor was just inferior to this and the nerve had to push up and around the tumor and was sitting directly on the tumor. He was freed from the tumor to be able push the tumor down and forward to separated from the facial nerve. There was fluid in this that ruptured and this fluid was suction irrigated out. This separated the tumor from the facial nerve and that was pulled superficial and anterior to the nerve. Once the nerve was about 2 cm out of the skull it separated into superior and inferior branches. The superior branch was left on did as the more superficial tumor was all inferior. Dissection was carried along the inferior ranch the facial nerve as it went downward. It coursed underneath the retro-facial vein and this was clamped cauterized with the Harmonic. The Harmonic scalpel was used throughout the procedure for cauterizing the parotid tissue as it was cut. The inferior branch facial nerve was tracked inferior to the tumor and the tumor and superficial lobe were pulled upward. Once I was completely underneath the tumor the anterior parotid fascia was cut across sparing as much as possible removing the bottom half of the superficial portion  of the gland. You could see the inferior branches the facial nerve coursing deep to this and stimulated well. Remaining parotid attachments were freed and the tumor was removed. The inferior mass was completely surrounded by parotid tissue. The deep mass was lying free in was tagged to notify pathology that this was the deep lobe mass.  The wound was copiously irrigated the mucosal tracked out the nerve stimulated well. The mean parotid was able to  fall back into its normal position. A 10 TLS drain was then placed underneath this through separate stab incision. 4-0 Mersilene were used to tag the remaining parotid fascia posteriorly to the sternocleidomastoid muscle and fascia. L close the defect to make a smooth facial contour. The skin was then draped back and had some excess skin was trimmed of about 1 cm area this was then sutured with 4-0 Vicryls for closing the dermis. Skin was then held in position with a 6-0 Prolene in a running locking suture. The drain was placed to low continuous Vacutainer suction there is very little blood coming out. The wound was then covered with bacitracin, Telfa, and Tegaderm.  The patient tolerated procedure well she was awakened taken to the recovery room in satisfactory condition there were no operative complications.  Dispo:   To PACU to be sent to the floor for observation tonight  Plan:  Plan to discharge home in the morning after the drain is removed and the dressing was replaced.  Angela Ballard  10/05/2016 10:22 AM

## 2016-10-05 NOTE — Progress Notes (Signed)
10/05/2016 6:20pm S- Pt feeling good. Eating regular food.  O- AVSS, Bp was up but not now. Pain controlled.       Facial weakness around left eye, but only partial weakness. Still can close eye and lift brow some.  Drain ok. Wound flat with no bruising or swelling.  A- Weakness left nerve is partial. Wound doing well.  P- Pull drain in am. Redress wound. D/c home tomorrow am.   Margaretha Sheffield

## 2016-10-06 DIAGNOSIS — D110 Benign neoplasm of parotid gland: Secondary | ICD-10-CM | POA: Diagnosis not present

## 2016-10-06 LAB — GLUCOSE, CAPILLARY: GLUCOSE-CAPILLARY: 196 mg/dL — AB (ref 65–99)

## 2016-10-06 LAB — SURGICAL PATHOLOGY

## 2016-10-06 NOTE — Discharge Summary (Signed)
Physician Discharge Summary  Patient ID: Angela Ballard MRN: [id number] DOB/AGE: 28-May-1951 65 y.o.  Admit date: 10/05/2016 Discharge date: 10/06/2016  Admission Diagnoses: Left parotid tumor 2  Discharge Diagnoses: Left parotid tumor 2 Active Problems:   Parotid tumor   Discharged Condition: good  Hospital Course: The patient had surgery yesterday for removal of a large superficial parotid tumor and then a smaller deep tumor that was right at the root of the facial nerve. Her pain is settling down and she is able to eat well. He does have some weakness around her left eye but it is a partial weakness and she still had movement of her for had an can close her eye well. She has not blinking as much is normal and knows the importance of keeping her eye lubricated and protected. She seems to have good mobility and the rest of her face. The drain was removed this morning and her wound looks great. The dressing was changed and she is discharged this morning. She'll be followed up in the office in 1 week for suture removal  Consults: None  Significant Diagnostic Studies: Await pathology  Treatments: surgery: Excision of left superficial parotid gland and tumors with sparing of the facial nerve  Discharge Exam: Blood pressure (!) 109/55, pulse 63, temperature 97.4 F (36.3 C), temperature source Oral, resp. rate 18, height 5\' 5"  (1.651 m), weight 93.4 kg (206 lb), SpO2 98 %. The wound is flat with no bruising or swelling. The drain is removed  Disposition: Final discharge disposition not confirmed  Discharge Instructions    Call MD for:  hives    Complete by:  As directed    Call MD for:  persistant nausea and vomiting    Complete by:  As directed    Call MD for:  redness, tenderness, or signs of infection (pain, swelling, redness, odor or green/yellow discharge around incision site)    Complete by:  As directed    Call MD for:  temperature >100.4    Complete by:  As directed    Increase activity slowly    Complete by:  As directed        Medication List    TAKE these medications   Biotin 10000 MCG Tabs Take 10,000 mcg by mouth daily.   CINNAMON PO Take 1,000 mg by mouth 2 (two) times daily.   Fish Oil 1200 MG Caps Take 1,200 mg by mouth 2 (two) times daily.   Garlic 123XX123 MG Caps Take 1,000 mg by mouth 2 (two) times daily.   ibuprofen 200 MG tablet Commonly known as:  ADVIL,MOTRIN Take 200-400 mg by mouth every 6 (six) hours as needed for headache or moderate pain.   lisinopril-hydrochlorothiazide 10-12.5 MG tablet Commonly known as:  PRINZIDE,ZESTORETIC Take 1 tablet by mouth daily.   Magnesium 500 MG Tabs Take 500 mg by mouth daily as needed (cramps).   metFORMIN 500 MG tablet Commonly known as:  GLUCOPHAGE Take 500 mg by mouth 2 (two) times daily.   MULTI-VITAMINS Tabs Take 1 tablet by mouth daily.   OVER THE COUNTER MEDICATION Take 400 mg by mouth daily. Berberine 400mg  tab   Red Yeast Rice 600 MG Tabs Take 600 mg by mouth daily.   TURMERIC PO Take 800 mg by mouth daily.   Zinc 50 MG Tabs Take 50 mg by mouth daily.        Signed: Roshelle Traub H 10/06/2016, 7:05 AM

## 2016-10-06 NOTE — Plan of Care (Signed)
Problem: Pain Managment: Goal: General experience of comfort will improve Outcome: Progressing PO pain meds (2 tabs) not lasting 4 hours before asking for more; required pain meds every 4 hrs throughout the night (8/10-6/10).

## 2016-10-06 NOTE — Progress Notes (Signed)
10/06/2016 08:55  Theophilus Bones to be D/C'd Home per MD order.  Discussed prescriptions and follow up appointments with the patient. Prescriptions given to patient, medication list explained in detail. Pt verbalized understanding.    Medication List    TAKE these medications   Biotin 10000 MCG Tabs Take 10,000 mcg by mouth daily.   CINNAMON PO Take 1,000 mg by mouth 2 (two) times daily.   Fish Oil 1200 MG Caps Take 1,200 mg by mouth 2 (two) times daily.   Garlic 123XX123 MG Caps Take 1,000 mg by mouth 2 (two) times daily.   ibuprofen 200 MG tablet Commonly known as:  ADVIL,MOTRIN Take 200-400 mg by mouth every 6 (six) hours as needed for headache or moderate pain.   lisinopril-hydrochlorothiazide 10-12.5 MG tablet Commonly known as:  PRINZIDE,ZESTORETIC Take 1 tablet by mouth daily.   Magnesium 500 MG Tabs Take 500 mg by mouth daily as needed (cramps).   metFORMIN 500 MG tablet Commonly known as:  GLUCOPHAGE Take 500 mg by mouth 2 (two) times daily.   MULTI-VITAMINS Tabs Take 1 tablet by mouth daily.   OVER THE COUNTER MEDICATION Take 400 mg by mouth daily. Berberine 400mg  tab   Red Yeast Rice 600 MG Tabs Take 600 mg by mouth daily.   TURMERIC PO Take 800 mg by mouth daily.   Zinc 50 MG Tabs Take 50 mg by mouth daily.       Vitals:   10/05/16 2131 10/06/16 0420  BP: (!) 113/50 (!) 109/55  Pulse: 70 63  Resp:  18  Temp: 98.3 F (36.8 C) 97.4 F (36.3 C)    Skin clean, dry and intact without evidence of skin break down, no evidence of skin tears noted. IV catheter discontinued intact. Site without signs and symptoms of complications. Dressing and pressure applied. Pt denies pain at this time. No complaints noted.  An After Visit Summary was printed and given to the patient. Patient escorted via [place], and D/C home via private auto.  Dola Argyle

## 2017-10-07 IMAGING — CT CT HEAD WO/W CM
3 of 4 series · 16 of 47 positions shown, 19 images · IV contrast (isovue)
Comparison: None available.

CLINICAL DATA: Evaluation for left-sided neck mass.

EXAM:
CT HEAD WITHOUT AND WITH CONTRAST
TECHNIQUE: Contiguous axial images were obtained from the base of the skull
through the vertex without and with intravenous contrast
CONTRAST:  75 cc of Isovue 370.

[Series 4: head w mpr · axial · 0.39mm/px · z∈[-87,+66]mm · 10 of 304 slices shown, 13 images]
[im 24/304  brain]
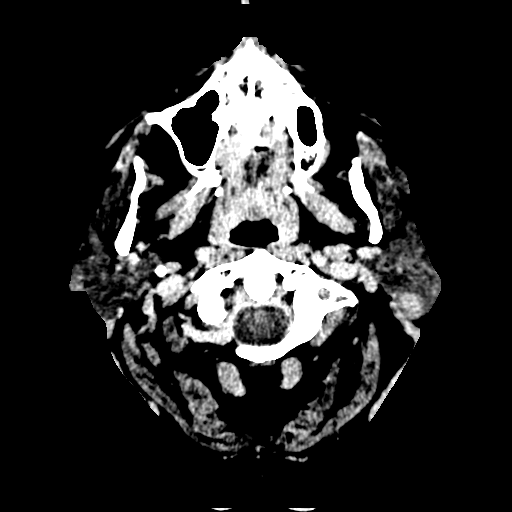
[im 24/304  bone]
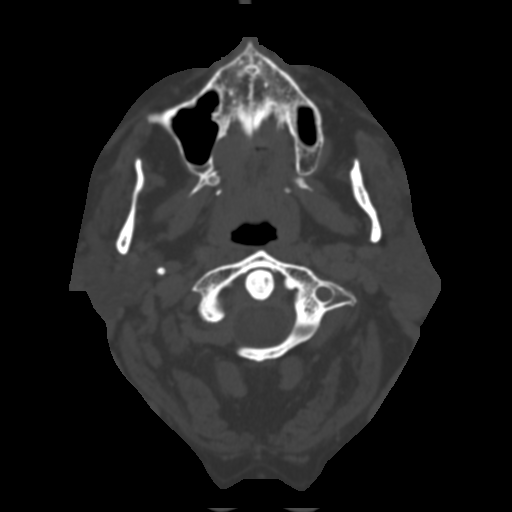
[im 47/304  brain]
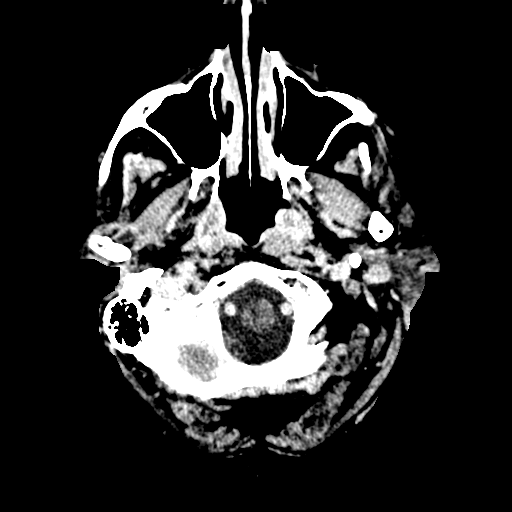
[im 82/304  brain]
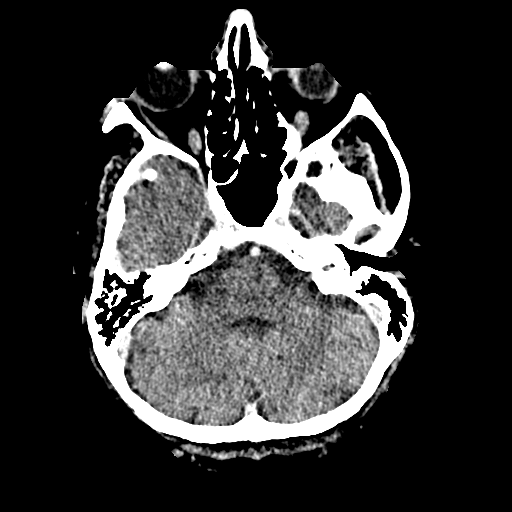
[im 105/304  brain]
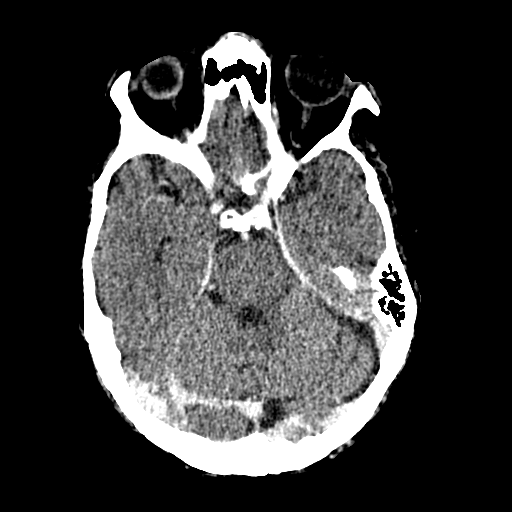
[im 140/304  brain]
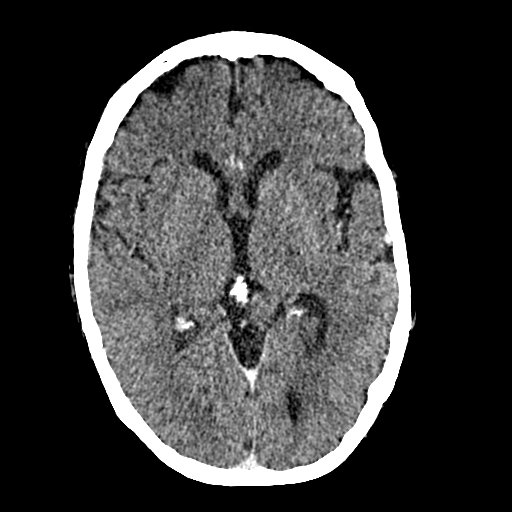
[im 140/304  bone]
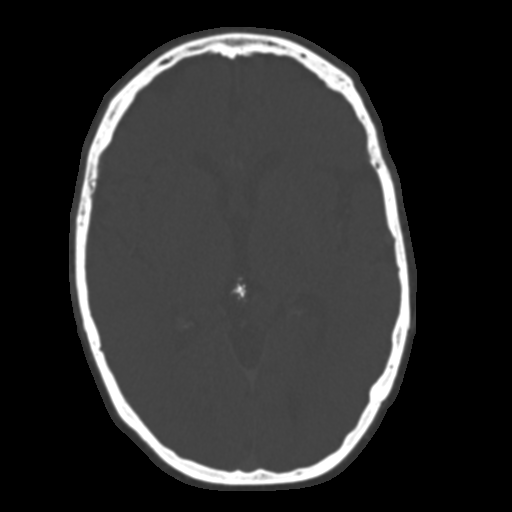
[im 164/304  brain]
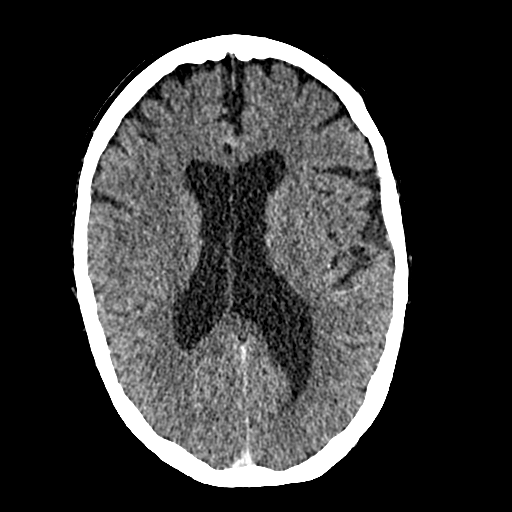
[im 199/304  brain]
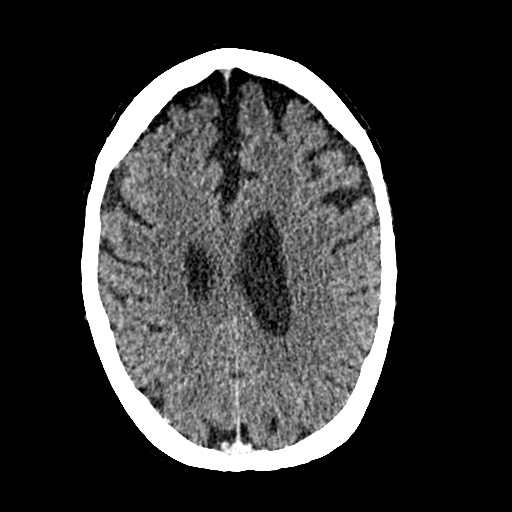
[im 222/304  brain]
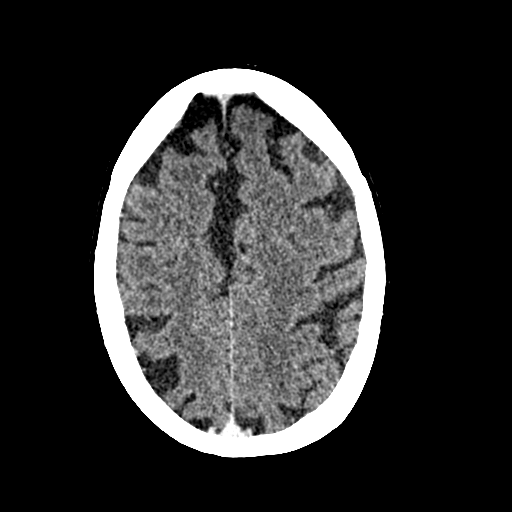
[im 257/304  brain]
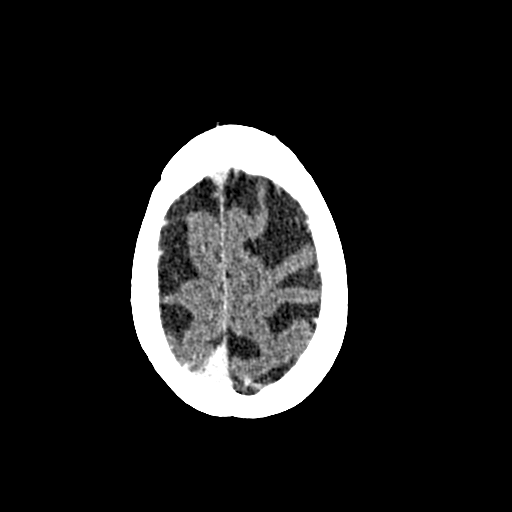
[im 257/304  bone]
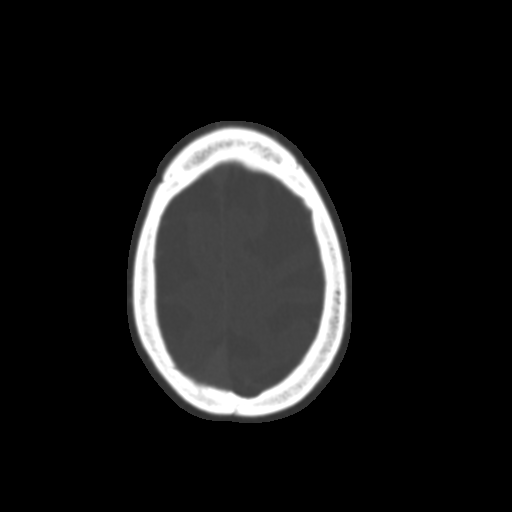
[im 280/304  brain]
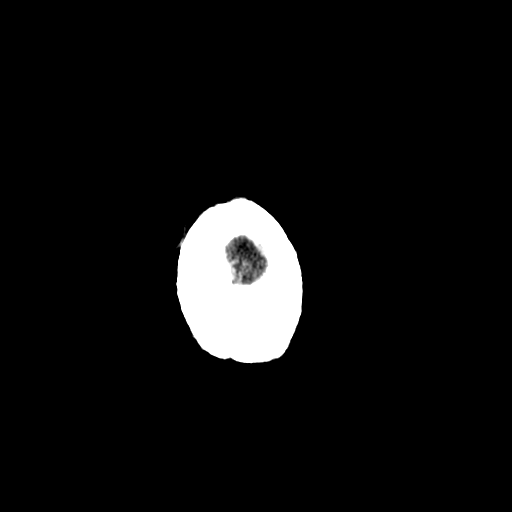

[Series 602: with coronal · coronal · 0.39mm/px · 3 of 62 slices shown]
[im 21/62  brain]
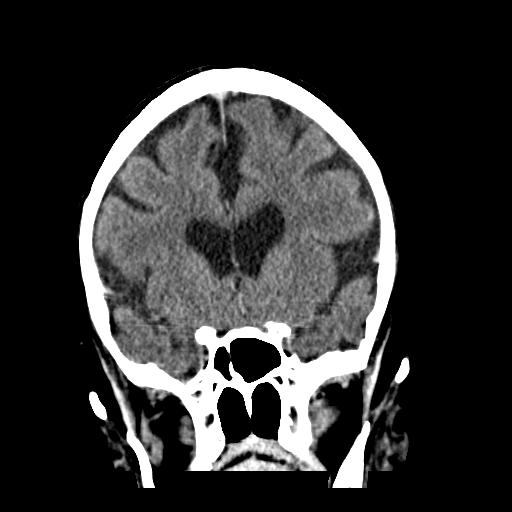
[im 28/62  brain]
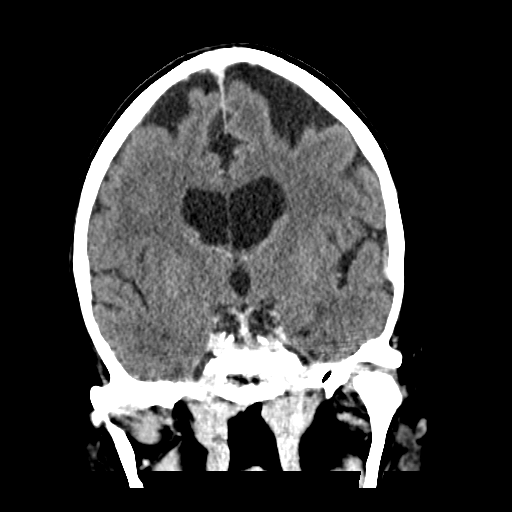
[im 34/62  brain]
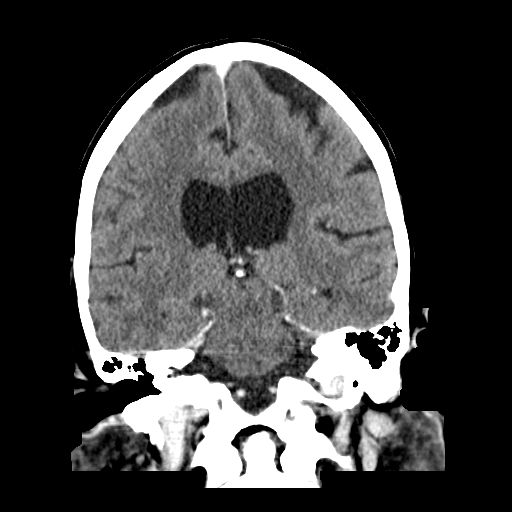

[Series 603: with sagittal · sagittal · 0.39mm/px · 3 of 48 slices shown]
[im 16/48  brain]
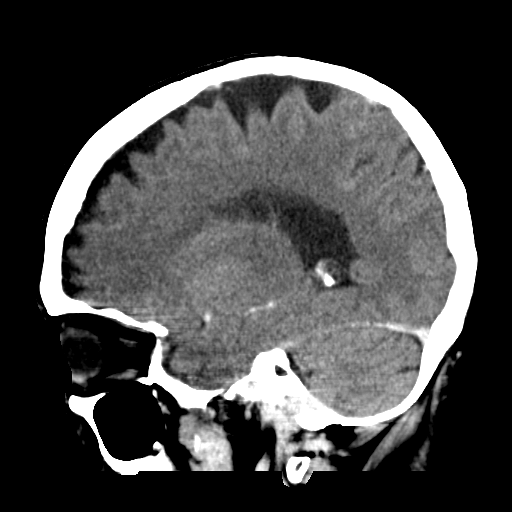
[im 24/48  brain]
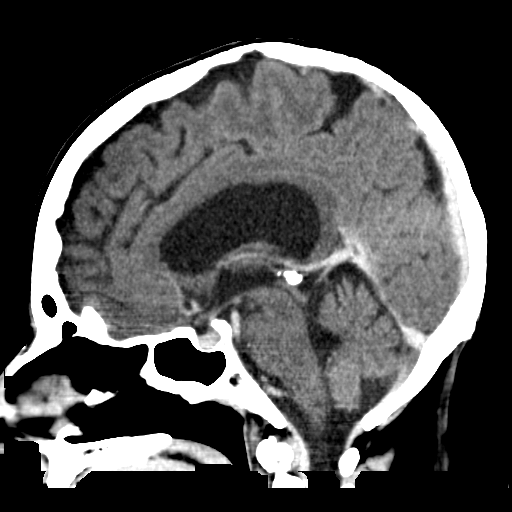
[im 32/48  brain]
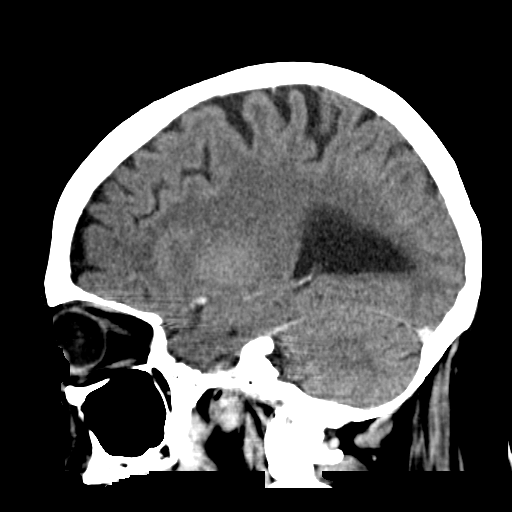

[16 of 47 positions shown; findings below may reference images not displayed]

FINDINGS: Brain: Generalized age-related cerebral volume loss present. Mild
chronic microvascular ischemic disease.

No acute intracranial hemorrhage. No findings to suggest acute large
vessel territory infarct. No mass lesion, midline shift or mass
effect. No hydrocephalus. No extra-axial fluid collection. No
abnormal enhancement.

Vascular: No hyperdense vessel. Scattered vascular calcifications
noted within the carotid siphons.

Skull: Scalp soft tissues within normal limits. 1 cm
well-circumscribed lesion at the deep lobe of the left parotid gland
is partially visualized (series 2, image 3). This is better
evaluated on concomitant CT of the neck. Calvarium intact.

Sinuses/Orbits: Globes and orbital soft tissues demonstrate no acute
abnormality. Visualized paranasal sinuses and mastoid air cells are
clear.
IMPRESSION: 1. No acute intracranial process identified.
2. Mild for age cerebral atrophy with chronic microvascular ischemic
disease.
3. 1 cm lesion at the deep lobe of the left parotid gland,
incompletely evaluated on this exam. This is better evaluated on
concomitant CT of the neck.

## 2022-06-10 DIAGNOSIS — E7800 Pure hypercholesterolemia, unspecified: Secondary | ICD-10-CM | POA: Diagnosis not present

## 2022-06-10 DIAGNOSIS — E119 Type 2 diabetes mellitus without complications: Secondary | ICD-10-CM | POA: Diagnosis not present

## 2022-06-17 DIAGNOSIS — M791 Myalgia, unspecified site: Secondary | ICD-10-CM | POA: Diagnosis not present

## 2022-06-17 DIAGNOSIS — E119 Type 2 diabetes mellitus without complications: Secondary | ICD-10-CM | POA: Diagnosis not present

## 2022-06-17 DIAGNOSIS — T466X5A Adverse effect of antihyperlipidemic and antiarteriosclerotic drugs, initial encounter: Secondary | ICD-10-CM | POA: Diagnosis not present

## 2022-06-17 DIAGNOSIS — E7800 Pure hypercholesterolemia, unspecified: Secondary | ICD-10-CM | POA: Diagnosis not present

## 2022-06-17 DIAGNOSIS — I10 Essential (primary) hypertension: Secondary | ICD-10-CM | POA: Diagnosis not present

## 2022-06-17 DIAGNOSIS — E871 Hypo-osmolality and hyponatremia: Secondary | ICD-10-CM | POA: Diagnosis not present

## 2022-06-17 DIAGNOSIS — F17210 Nicotine dependence, cigarettes, uncomplicated: Secondary | ICD-10-CM | POA: Diagnosis not present

## 2022-06-23 DIAGNOSIS — E7800 Pure hypercholesterolemia, unspecified: Secondary | ICD-10-CM | POA: Diagnosis not present

## 2022-06-23 DIAGNOSIS — I10 Essential (primary) hypertension: Secondary | ICD-10-CM | POA: Diagnosis not present

## 2022-06-23 DIAGNOSIS — E119 Type 2 diabetes mellitus without complications: Secondary | ICD-10-CM | POA: Diagnosis not present

## 2022-06-23 DIAGNOSIS — E871 Hypo-osmolality and hyponatremia: Secondary | ICD-10-CM | POA: Diagnosis not present

## 2022-06-23 DIAGNOSIS — M791 Myalgia, unspecified site: Secondary | ICD-10-CM | POA: Diagnosis not present

## 2022-07-07 DIAGNOSIS — E875 Hyperkalemia: Secondary | ICD-10-CM | POA: Diagnosis not present

## 2022-10-07 DIAGNOSIS — I10 Essential (primary) hypertension: Secondary | ICD-10-CM | POA: Diagnosis not present

## 2022-10-07 DIAGNOSIS — E119 Type 2 diabetes mellitus without complications: Secondary | ICD-10-CM | POA: Diagnosis not present

## 2022-10-07 DIAGNOSIS — M791 Myalgia, unspecified site: Secondary | ICD-10-CM | POA: Diagnosis not present

## 2022-10-07 DIAGNOSIS — E7800 Pure hypercholesterolemia, unspecified: Secondary | ICD-10-CM | POA: Diagnosis not present

## 2022-12-28 DIAGNOSIS — E7800 Pure hypercholesterolemia, unspecified: Secondary | ICD-10-CM | POA: Diagnosis not present

## 2022-12-28 DIAGNOSIS — E119 Type 2 diabetes mellitus without complications: Secondary | ICD-10-CM | POA: Diagnosis not present

## 2023-01-04 DIAGNOSIS — M791 Myalgia, unspecified site: Secondary | ICD-10-CM | POA: Diagnosis not present

## 2023-01-04 DIAGNOSIS — Z1331 Encounter for screening for depression: Secondary | ICD-10-CM | POA: Diagnosis not present

## 2023-01-04 DIAGNOSIS — I10 Essential (primary) hypertension: Secondary | ICD-10-CM | POA: Diagnosis not present

## 2023-01-04 DIAGNOSIS — Z0000 Encounter for general adult medical examination without abnormal findings: Secondary | ICD-10-CM | POA: Diagnosis not present

## 2023-01-04 DIAGNOSIS — E7800 Pure hypercholesterolemia, unspecified: Secondary | ICD-10-CM | POA: Diagnosis not present

## 2023-01-04 DIAGNOSIS — N3941 Urge incontinence: Secondary | ICD-10-CM | POA: Diagnosis not present

## 2023-01-04 DIAGNOSIS — T466X5A Adverse effect of antihyperlipidemic and antiarteriosclerotic drugs, initial encounter: Secondary | ICD-10-CM | POA: Diagnosis not present

## 2023-01-04 DIAGNOSIS — E119 Type 2 diabetes mellitus without complications: Secondary | ICD-10-CM | POA: Diagnosis not present

## 2023-01-04 DIAGNOSIS — F17210 Nicotine dependence, cigarettes, uncomplicated: Secondary | ICD-10-CM | POA: Diagnosis not present

## 2023-07-02 DIAGNOSIS — E7800 Pure hypercholesterolemia, unspecified: Secondary | ICD-10-CM | POA: Diagnosis not present

## 2023-07-02 DIAGNOSIS — E119 Type 2 diabetes mellitus without complications: Secondary | ICD-10-CM | POA: Diagnosis not present

## 2023-07-09 DIAGNOSIS — N3941 Urge incontinence: Secondary | ICD-10-CM | POA: Diagnosis not present

## 2023-07-09 DIAGNOSIS — F17210 Nicotine dependence, cigarettes, uncomplicated: Secondary | ICD-10-CM | POA: Diagnosis not present

## 2023-07-09 DIAGNOSIS — M791 Myalgia, unspecified site: Secondary | ICD-10-CM | POA: Diagnosis not present

## 2023-07-09 DIAGNOSIS — I10 Essential (primary) hypertension: Secondary | ICD-10-CM | POA: Diagnosis not present

## 2023-07-09 DIAGNOSIS — T466X5D Adverse effect of antihyperlipidemic and antiarteriosclerotic drugs, subsequent encounter: Secondary | ICD-10-CM | POA: Diagnosis not present

## 2023-07-09 DIAGNOSIS — E7800 Pure hypercholesterolemia, unspecified: Secondary | ICD-10-CM | POA: Diagnosis not present

## 2023-07-09 DIAGNOSIS — E119 Type 2 diabetes mellitus without complications: Secondary | ICD-10-CM | POA: Diagnosis not present

## 2023-12-31 DIAGNOSIS — E7800 Pure hypercholesterolemia, unspecified: Secondary | ICD-10-CM | POA: Diagnosis not present

## 2023-12-31 DIAGNOSIS — E119 Type 2 diabetes mellitus without complications: Secondary | ICD-10-CM | POA: Diagnosis not present

## 2024-01-07 DIAGNOSIS — F17210 Nicotine dependence, cigarettes, uncomplicated: Secondary | ICD-10-CM | POA: Diagnosis not present

## 2024-01-07 DIAGNOSIS — Z0000 Encounter for general adult medical examination without abnormal findings: Secondary | ICD-10-CM | POA: Diagnosis not present

## 2024-01-07 DIAGNOSIS — N3941 Urge incontinence: Secondary | ICD-10-CM | POA: Diagnosis not present

## 2024-01-07 DIAGNOSIS — Z1331 Encounter for screening for depression: Secondary | ICD-10-CM | POA: Diagnosis not present

## 2024-01-07 DIAGNOSIS — E119 Type 2 diabetes mellitus without complications: Secondary | ICD-10-CM | POA: Diagnosis not present

## 2024-01-07 DIAGNOSIS — I10 Essential (primary) hypertension: Secondary | ICD-10-CM | POA: Diagnosis not present

## 2024-01-07 DIAGNOSIS — M791 Myalgia, unspecified site: Secondary | ICD-10-CM | POA: Diagnosis not present

## 2024-01-07 DIAGNOSIS — E7800 Pure hypercholesterolemia, unspecified: Secondary | ICD-10-CM | POA: Diagnosis not present

## 2024-01-07 DIAGNOSIS — R5383 Other fatigue: Secondary | ICD-10-CM | POA: Diagnosis not present

## 2024-01-21 DIAGNOSIS — I10 Essential (primary) hypertension: Secondary | ICD-10-CM | POA: Diagnosis not present

## 2024-01-21 DIAGNOSIS — E7800 Pure hypercholesterolemia, unspecified: Secondary | ICD-10-CM | POA: Diagnosis not present

## 2024-01-21 DIAGNOSIS — R809 Proteinuria, unspecified: Secondary | ICD-10-CM | POA: Diagnosis not present

## 2024-01-21 DIAGNOSIS — M791 Myalgia, unspecified site: Secondary | ICD-10-CM | POA: Diagnosis not present

## 2024-01-21 DIAGNOSIS — E1129 Type 2 diabetes mellitus with other diabetic kidney complication: Secondary | ICD-10-CM | POA: Diagnosis not present

## 2024-01-25 DIAGNOSIS — D582 Other hemoglobinopathies: Secondary | ICD-10-CM | POA: Diagnosis not present

## 2024-02-10 DIAGNOSIS — R531 Weakness: Secondary | ICD-10-CM | POA: Diagnosis not present

## 2024-02-10 DIAGNOSIS — D582 Other hemoglobinopathies: Secondary | ICD-10-CM | POA: Diagnosis not present

## 2024-02-10 DIAGNOSIS — R5383 Other fatigue: Secondary | ICD-10-CM | POA: Diagnosis not present

## 2024-02-10 DIAGNOSIS — R2681 Unsteadiness on feet: Secondary | ICD-10-CM | POA: Diagnosis not present

## 2024-02-10 DIAGNOSIS — R634 Abnormal weight loss: Secondary | ICD-10-CM | POA: Diagnosis not present

## 2024-02-28 ENCOUNTER — Ambulatory Visit: Attending: Family Medicine | Admitting: Physical Therapy

## 2024-02-28 VITALS — BP 160/80 | HR 82

## 2024-02-28 DIAGNOSIS — R2689 Other abnormalities of gait and mobility: Secondary | ICD-10-CM | POA: Insufficient documentation

## 2024-02-28 DIAGNOSIS — M6281 Muscle weakness (generalized): Secondary | ICD-10-CM | POA: Insufficient documentation

## 2024-02-28 DIAGNOSIS — R262 Difficulty in walking, not elsewhere classified: Secondary | ICD-10-CM | POA: Diagnosis not present

## 2024-02-28 NOTE — Therapy (Signed)
 OUTPATIENT PHYSICAL THERAPY BALANCE EVALUATION   Patient Name: Angela Ballard MRN: [id number] DOB:1951/10/05, 73 y.o., female Today's Date: 02/28/2024   PT End of Session - 02/28/24 1106     Visit Number 1    Number of Visits 21    Date for PT Re-Evaluation 05/08/24    Authorization Type [employer] 2025, VL based on auth    PT Start Time 1110    PT Stop Time 1153    PT Time Calculation (min) 43 min    Equipment Utilized During Treatment Gait belt             Past Medical History:  Diagnosis Date   Diabetes mellitus without complication (HCC)    Family history of adverse reaction to anesthesia    GRANDFATHER-PASSED AWAY DURING SURGERY-UNSURE   Headache    H/O   Hyperlipidemia    Hypertension    Past Surgical History:  Procedure Laterality Date   APPENDECTOMY     PAROTIDECTOMY Left 10/05/2016   Procedure: PAROTIDECTOMY;  Surgeon: Mellody Sprout, MD;  Location: [facility];  Service: ENT;  Laterality: Left;   TONSILECTOMY, ADENOIDECTOMY, BILATERAL MYRINGOTOMY AND TUBES  child   TONSILLECTOMY     WISDOM TOOTH EXTRACTION  teenager   Patient Active Problem List   Diagnosis Date Noted   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   Neck mass 08/27/2016   Controlled type 2 diabetes mellitus without complication, without long-term current use of insulin  (HCC) 06/21/2015   Episodic low back pain 06/15/2014   Benign essential hypertension 03/09/2014   Pure hypercholesterolemia 03/09/2014   Type 2 diabetes mellitus, controlled (HCC) 03/09/2014    PCP: Lorrie Rothman, MD  REFERRING PROVIDER: Lorrie Rothman, MD  REFERRING DIAGNOSIS:  R26.81 (ICD-10-CM) - Unsteadiness on feet  R53.1 (ICD-10-CM) - Weakness    THERAPY DIAG: Muscle weakness (generalized)  Difficulty in walking, not elsewhere classified  Imbalance  ONSET DATE: Over previous year, worsening balance/stability  FOLLOW UP APPT WITH PROVIDER: Yes ; f/u on 04/11/24   RATIONALE FOR EVALUATION AND TREATMENT:  Rehabilitation  SUBJECTIVE:                                                                                                                                                                                         Chief Complaint: Pt is a 73 year old female referred for unsteadiness and weakness by her PCP. Pt has struggled with recent weight loss and fatigue.   Pertinent History Pt is a 73 year old female referred for unsteadiness and weakness by her PCP. Hx of multiple falls. Pt was also referred to hematology to check hemoglobin. Pt is following up with hematology this Thursday.  Pt reports having a lot of falls - "every few weeks here lately." Pt reports fear related to falling. Pt reports falling in street and having to ask neighbors for help to get up; she tripped on loose gravel (this happened 2 weeks ago). Patient reports no hx of neuropathy; pt reports her feet feel "strange" or feel as if she is dragging while she is walking. Pt reports some loss of appetite and weight loss. Patient reports some atrophy in her legs. Pt denies night pain/disturbed sleep. Pt reports some urinary frequency at night time.    Pain: No Numbness/Tingling: No Focal Weakness: Yes; weakness in legs specifically Recent changes in overall health/medication: Yes; pt cutting back on Metformin  dosage per PCP, discontinued cholesterol medication Prior history of physical therapy for balance:  No Falls: Has patient fallen in last 6 months? 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 Grass walkway up to steps.  Has following equipment at home: Otho Blitz - 2 wheeled, quad cane, other cane that pt does not feel safe with     Patient Goals: "Get some stability"    OBJECTIVE:   Patient Surveys  ABC: 60%   Cognition Patient is oriented to person, place, and time.  Recent memory is intact.  Remote memory is intact.  Attention span and concentration are intact.  Expressive speech is intact.  Patient's fund of knowledge is within normal limits for educational level.     Gross Musculoskeletal Assessment Tremor: None Bulk: Atrophy in R>L quads Tone: Normal R hip hump, L scapula sits higher than R   GAIT: Distance walked: 80 ft Assistive device utilized: None Level of assistance: CGA Comments: Shortened step/stride length bilaterally, decreased heel strike at initial contact, maintains forward flexed posture throughout gait cycle, decreased terminal knee extension with terminal swing, decreased arm swing and truncal rigidity   Posture: Forward flexed posture in sitting and standing     LE MMT:  MMT (out of 5) Right 02/28/2024 Left 02/28/2024  Hip flexion 4- 4-  Hip extension    Hip abduction (seated) 4 4  Hip adduction (seated)  5 5  Hip internal rotation    Hip external rotation    Knee flexion 4+ 4  Knee extension 4+ 4+  Ankle dorsiflexion 4- 4-  Ankle plantarflexion    Ankle inversion    Ankle eversion    (* = pain; Blank rows = not tested)   Sensation Deferred   Reflexes Deferred   Cranial Nerves Deferred    Coordination/Cerebellar Finger to Nose: WNL Heel to Shin: WNL Rapid alternating movements: Impaired Ues, WNL LE Finger Opposition: WNL Pronator Drift:  Negative   Postural Control Romberg EO/EC WNL Sharpened Romberg: 7 sec   FUNCTIONAL OUTCOME MEASURES   Results Comments  BERG Next visit/56   DGI Next visit/24   TUG 24.8 seconds Fall risk, in need of intervention  5TSTS Unable to complete sit to stand in standard position Fall risk, in need of intervention  (Blank rows = not tested)    TODAY'S TREATMENT    Vitals:   02/28/24 1120 02/28/24 1126  BP: (!) 182/81 (!) 160/80  Pulse: 82   SpO2: 99%      Therapeutic Activities - for establishment of plan of care, fall risk education, PT prognosis   Discussed regular BP monitoring and following up with PCP if it continues to remain well above 140 for systolic BP.  Patient education on current condition, role of PT, prognosis, plan of care. Discussion on use of AD to prevent falls and use of sit to stand exercise at home to reduce fall risk and improve capacity for transferring (performed with hands on anterior thighs to assist with transferring).   Discussed PT POC/prognosis.   -Trial of gait with FWW x 80 ft and discussion on appropriate AD placement for safety    PATIENT EDUCATION:  Education details: see above for patient education details Person educated: Patient Education method: Medical illustrator Education comprehension: verbalized understanding and returned demonstration   HOME EXERCISE PROGRAM: Formal HEP to be given next visit   ASSESSMENT:  CLINICAL IMPRESSION: Patient is a 73 y.o. female who was seen today for physical therapy evaluation and treatment for imbalance/unsteadiness; pt has lost weight and had notable fatigue recently. No significant neurological history or Hx of neuropathy. Pt has been referred to hematology to investigate hemoglobin. SpO2 is WNL today with exercise performed.  Objective impairments include Abnormal gait, decreased balance, difficulty walking, decreased strength, and postural dysfunction. These impairments are  limiting patient from cleaning, laundry, shopping, and community activity. Personal factors including Age, Time since onset of injury/illness/exacerbation, and 3+ comorbidities: (Type 2 DM, HTN, tobacco use Hx)  are also affecting patient's functional outcome. Patient will benefit from skilled PT to address above impairments and improve overall function.  REHAB POTENTIAL: Good  CLINICAL DECISION MAKING: Evolving/moderate complexity  EVALUATION COMPLEXITY: Moderate   GOALS: Goals reviewed with patient? No  SHORT TERM GOALS: Target date: 03/20/2024  Pt will be independent with HEP in order to improve strength and balance in order to decrease fall risk and improve function at home. Baseline: 02/28/24: Baseline HEP to be initiated on visit #2; discussed use of sit to stand (with UE assist prn) as exercise to reduce fall risk.  Goal status: INITIAL  Pt will perform independent sit to stand with no upper extremity support indicative of improved LE strength/power as needed to prevent fall and improved capacity for independent transferring Baseline: 02/28/24: Unable to complete sit to stand without UE assist.  Goal status: INITIAL  LONG TERM GOALS: Target date: 05/11/2024  Pt will negotiate 6 stairs with 1-2 handrail support safely with use of LRAD and no LOB as needed for entering/exiting home Baseline: 02/28/24: Has to use stairs for entering/exiting home, pt prone to tripping/falling.  Goal status: INITIAL  2.  Pt will improve BERG by at least 3 points in order to demonstrate clinically significant improvement in balance.   Baseline: 02/28/24: To be obtained on visit # 2.  Goal status: INITIAL  3.  Pt will improve ABC by at least 13% in order to demonstrate clinically significant improvement in balance confidence.      Baseline: 02/28/24: 60% Goal status: INITIAL  5. Pt will improve DGI by at least 3 points in order to demonstrate clinically significant improvement in balance and decreased risk  for falls.  Baseline: 02/28/24: To be obtained on visit # 2.  Goal status: INITIAL  6. Pt will decrease TUG to below 14 seconds/decrease in order to demonstrate decreased fall risk.  Baseline: 02/28/24: 24.8 sec  Goal status: INITIAL    PLAN: PT FREQUENCY: 2x/week  PT DURATION: 12 weeks  PLANNED INTERVENTIONS: Therapeutic exercises, Therapeutic activity, Neuromuscular re-education, Balance training, Gait training, Patient/Family education, Joint manipulation, Joint mobilization, Canalith repositioning, Aquatic Therapy, Dry Needling, Cognitive remediation, Electrical stimulation, Spinal manipulation, Spinal mobilization, Cryotherapy, Moist heat, Traction, Ultrasound, Ionotophoresis 4mg /ml Dexamethasone , and Manual therapy  PLAN FOR NEXT SESSION: BERG, DGI testing. Further postural control  assessment prn. Continue with LE strengthening and balance training. Update HEP.   Denese Finn, PT, DPT #[id number]  Aleatha Hunting 02/28/2024, 11:27 AM

## 2024-03-01 ENCOUNTER — Ambulatory Visit: Admitting: Physical Therapy

## 2024-03-01 VITALS — BP 164/90 | HR 77

## 2024-03-01 DIAGNOSIS — R2689 Other abnormalities of gait and mobility: Secondary | ICD-10-CM | POA: Diagnosis not present

## 2024-03-01 DIAGNOSIS — M6281 Muscle weakness (generalized): Secondary | ICD-10-CM

## 2024-03-01 DIAGNOSIS — R262 Difficulty in walking, not elsewhere classified: Secondary | ICD-10-CM | POA: Diagnosis not present

## 2024-03-01 NOTE — Therapy (Unsigned)
 OUTPATIENT PHYSICAL THERAPY TREATMENT   Patient Name: Angela Ballard MRN: [id number] DOB:Jan 07, 1951, 74 y.o., female Today's Date: 03/01/2024   PT End of Session - 03/01/24 0841     Visit Number 2    Number of Visits 21    Date for PT Re-Evaluation 05/08/24    Authorization Type [employer] 2025, VL based on auth    PT Start Time 0900    PT Stop Time 0942    PT Time Calculation (min) 42 min    Equipment Utilized During Treatment Gait belt    Activity Tolerance Patient tolerated treatment well    Behavior During Therapy WFL for tasks assessed/performed              Past Medical History:  Diagnosis Date   Diabetes mellitus without complication (HCC)    Family history of adverse reaction to anesthesia    GRANDFATHER-PASSED AWAY DURING SURGERY-UNSURE   Headache    H/O   Hyperlipidemia    Hypertension    Past Surgical History:  Procedure Laterality Date   APPENDECTOMY     PAROTIDECTOMY Left 10/05/2016   Procedure: PAROTIDECTOMY;  Surgeon: Mellody Sprout, MD;  Location: [facility];  Service: ENT;  Laterality: Left;   TONSILECTOMY, ADENOIDECTOMY, BILATERAL MYRINGOTOMY AND TUBES  child   TONSILLECTOMY     WISDOM TOOTH EXTRACTION  teenager   Patient Active Problem List   Diagnosis Date Noted   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   Neck mass 08/27/2016   Controlled type 2 diabetes mellitus without complication, without long-term current use of insulin  (HCC) 06/21/2015   Episodic low back pain 06/15/2014   Benign essential hypertension 03/09/2014   Pure hypercholesterolemia 03/09/2014   Type 2 diabetes mellitus, controlled (HCC) 03/09/2014    PCP: Lorrie Rothman, MD  REFERRING PROVIDER: Lorrie Rothman, MD  REFERRING DIAGNOSIS:  R26.81 (ICD-10-CM) - Unsteadiness on feet  R53.1 (ICD-10-CM) - Weakness    THERAPY DIAG: Muscle weakness (generalized)  Difficulty in walking, not elsewhere classified  Imbalance  ONSET DATE: Over previous year, worsening  balance/stability  FOLLOW UP APPT WITH PROVIDER: Yes ; f/u on 04/11/24   RATIONALE FOR EVALUATION AND TREATMENT: Rehabilitation  SUBJECTIVE:                                                                                                                                                                                         Chief Complaint: Pt is a 73 year old female referred for unsteadiness and weakness by her PCP. Pt has struggled with recent weight loss and fatigue.   Pertinent History Pt is a 72 year old female referred for unsteadiness and weakness by her PCP. Hx of  multiple falls. Pt was also referred to hematology to check hemoglobin. Pt is following up with hematology this Thursday. Pt reports having a lot of falls - "every few weeks here lately." Pt reports fear related to falling. Pt reports falling in street and having to ask neighbors for help to get up; she tripped on loose gravel (this happened 2 weeks ago). Patient reports no hx of neuropathy; pt reports her feet feel "strange" or feel as if she is dragging while she is walking. Pt reports some loss of appetite and weight loss. Patient reports some atrophy in her legs. Pt denies night pain/disturbed sleep. Pt reports some urinary frequency at night time.    Pain: No Numbness/Tingling: No Focal Weakness: Yes; weakness in legs specifically Recent changes in overall health/medication: Yes; pt cutting back on Metformin  dosage per PCP, discontinued cholesterol medication Prior history of physical therapy for balance:  No Falls: Has patient fallen in last 6 months? 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 Grass walkway up to steps.  Has following equipment at home: Otho Blitz - 2 wheeled, quad cane, other cane that pt does not feel safe with     Patient Goals: "Get some stability"    OBJECTIVE:   Patient Surveys  ABC: 60%   Cognition Patient is oriented to person, place, and time.  Recent memory is intact.  Remote memory is intact.  Attention span and concentration are intact.  Expressive speech is intact.  Patient's fund of knowledge is within normal limits for educational level.     Gross Musculoskeletal Assessment Tremor: None Bulk: Atrophy in R>L quads Tone: Normal R hip hump, L scapula sits higher than R   GAIT: Distance walked: 80 ft Assistive device utilized: None Level of assistance: CGA Comments: Shortened step/stride length bilaterally, decreased heel strike at initial contact, maintains forward flexed posture throughout gait cycle, decreased terminal knee extension with terminal swing, decreased arm swing and truncal rigidity   Posture: Forward flexed posture in sitting and standing     LE MMT:  MMT (out of 5) Right 02/28/2024 Left 02/28/2024  Hip flexion 4- 4-  Hip extension    Hip abduction (seated) 4 4  Hip adduction (seated)  5 5  Hip internal rotation    Hip external rotation    Knee flexion 4+ 4  Knee extension 4+ 4+  Ankle dorsiflexion 4- 4-  Ankle plantarflexion    Ankle inversion    Ankle eversion    (* = pain; Blank rows = not tested)   Sensation Deferred   Reflexes Deferred   Cranial Nerves Deferred    Coordination/Cerebellar Finger to Nose: WNL Heel to  Shin: WNL Rapid alternating movements: Impaired Ues, WNL LE Finger Opposition: WNL Pronator Drift: Negative   Postural Control Romberg EO/EC WNL Sharpened Romberg: 7 sec   FUNCTIONAL OUTCOME MEASURES   Results Comments  BERG  42/56   DGI 12/24   TUG 24.8 seconds Fall risk, in need of intervention  5TSTS Unable to complete sit to stand in standard position Fall risk, in need of intervention  (Blank rows = not tested)    TODAY'S TREATMENT    Vitals:   03/01/24 0903 03/01/24 0914  BP: (!) 209/79 (!) 164/90  Pulse: 77   SpO2: 99%      SUBJECTIVE STATEMENT:       Neuromuscular Re-education - for improved sensory integration, static and dynamic postural control, equilibrium and non-equilibrium coordination as needed for negotiating home and community environment and stepping over obstacles  BERG and DGI performance    [facility] PT Assessment - 03/01/24 0001       Standardized Balance Assessment   Standardized Balance Assessment Berg Balance Test;Dynamic Gait Index      Berg Balance Test   Sit to Stand Able to stand  independently using hands    Standing Unsupported Able to stand safely 2 minutes    Sitting with Back Unsupported but Feet Supported on Floor or Stool Able to sit safely and securely 2 minutes    Stand to Sit Sits safely with minimal use of hands    Transfers Able to transfer safely, minor use of hands    Standing Unsupported with Eyes Closed Able to stand 10 seconds safely    Standing Unsupported with Feet Together Able to place feet together independently and stand 1 minute safely    From Standing, Reach Forward with Outstretched Arm Can reach forward >12 cm safely (5")    From Standing Position, Pick up Object from Floor Able to pick up shoe safely and easily    From Standing Position, Turn to Look Behind Over each Shoulder Looks behind from both sides and weight shifts well    Turn 360 Degrees Able to turn 360 degrees safely but slowly    Standing  Unsupported, Alternately Place Feet on Step/Stool Needs assistance to keep from falling or unable to try    Standing Unsupported, One Foot in Front Needs help to step but can hold 15 seconds    Standing on One Leg Tries to lift leg/unable to hold 3 seconds but remains standing independently    Total Score 42      Dynamic Gait Index   Level Surface Moderate Impairment    Change in Gait Speed Moderate Impairment    Gait with Horizontal Head Turns Mild Impairment    Gait with Vertical Head Turns Mild Impairment    Gait and Pivot Turn Mild Impairment    Step Over Obstacle Moderate Impairment    Step Around Obstacles Mild Impairment    Steps Moderate Impairment    Total Score 12               Therapeutic Exercise - improved strength as needed to improve performance of CKC activities/functional movements and as needed for power production to prevent fall during episode of large postural perturbation  Sit to stand; reviewed for HEP Standing march, at counter; 2 x 10 alternating R/L Standing HR/TR; reviewed for HEP  PATIENT EDUCATION:    PATIENT EDUCATION:  Education details: see above for patient education details Person educated: Patient Education method: Medical illustrator Education comprehension: verbalized understanding and returned demonstration   HOME EXERCISE PROGRAM: Formal HEP to be given next visit   ASSESSMENT:  CLINICAL IMPRESSION: Patient is a 73 y.o. female who was seen today for physical therapy evaluation and treatment for imbalance/unsteadiness;  pt has lost weight and had notable fatigue recently. No significant neurological history or Hx of neuropathy. Pt has been referred to hematology to investigate hemoglobin. SpO2 is WNL today with exercise performed.  Objective impairments include Abnormal gait, decreased balance, difficulty walking, decreased strength, and postural dysfunction. These impairments are limiting patient from cleaning, laundry,  shopping, and community activity. Personal factors including Age, Time since onset of injury/illness/exacerbation, and 3+ comorbidities: (Type 2 DM, HTN, tobacco use Hx)  are also affecting patient's functional outcome. Patient will benefit from skilled PT to address above impairments and improve overall function.  REHAB POTENTIAL: Good  CLINICAL DECISION MAKING: Evolving/moderate complexity  EVALUATION COMPLEXITY: Moderate   GOALS: Goals reviewed with patient? No  SHORT TERM GOALS: Target date: 03/20/2024  Pt will be independent with HEP in order to improve strength and balance in order to decrease fall risk and improve function at home. Baseline: 02/28/24: Baseline HEP to be initiated on visit #2; discussed use of sit to stand (with UE assist prn) as exercise to reduce fall risk.  Goal status: INITIAL  Pt will perform independent sit to stand with no upper extremity support indicative of improved LE strength/power as needed to prevent fall and improved capacity for independent transferring Baseline: 02/28/24: Unable to complete sit to stand without UE assist.  Goal status: INITIAL  LONG TERM GOALS: Target date: 05/11/2024  Pt will negotiate 6 stairs with 1-2 handrail support safely with use of LRAD and no LOB as needed for entering/exiting home Baseline: 02/28/24: Has to use stairs for entering/exiting home, pt prone to tripping/falling.  Goal status: INITIAL  2.  Pt will improve BERG by at least 3 points in order to demonstrate clinically significant improvement in balance.   Baseline: 02/28/24: To be obtained on visit # 2.    03/01/24: 42/56 Goal status: INITIAL  3.  Pt will improve ABC by at least 13% in order to demonstrate clinically significant improvement in balance confidence.      Baseline: 02/28/24: 60% Goal status: INITIAL  5. Pt will improve DGI by at least 3 points in order to demonstrate clinically significant improvement in balance and decreased risk for falls.     Baseline:  02/28/24: To be obtained on visit # 2.   03/01/24: 12/24 Goal status: INITIAL  6. Pt will decrease TUG to below 14 seconds/decrease in order to demonstrate decreased fall risk.  Baseline: 02/28/24: 24.8 sec  Goal status: INITIAL    PLAN: PT FREQUENCY: 2x/week  PT DURATION: 12 weeks  PLANNED INTERVENTIONS: Therapeutic exercises, Therapeutic activity, Neuromuscular re-education, Balance training, Gait training, Patient/Family education, Joint manipulation, Joint mobilization, Canalith repositioning, Aquatic Therapy, Dry Needling, Cognitive remediation, Electrical stimulation, Spinal manipulation, Spinal mobilization, Cryotherapy, Moist heat, Traction, Ultrasound, Ionotophoresis 4mg /ml Dexamethasone , and Manual therapy  PLAN FOR NEXT SESSION: BERG, DGI testing. Further postural control  assessment prn. Continue with LE strengthening and balance training. Update HEP.   Denese Finn, PT, DPT #[id number]  Aleatha Hunting 03/01/2024, 9:50 AM

## 2024-03-02 ENCOUNTER — Encounter: Payer: Self-pay | Admitting: Oncology

## 2024-03-02 ENCOUNTER — Inpatient Hospital Stay

## 2024-03-02 ENCOUNTER — Inpatient Hospital Stay: Attending: Oncology | Admitting: Oncology

## 2024-03-02 VITALS — BP 188/89 | HR 86 | Temp 99.0°F | Resp 16 | Ht 67.0 in | Wt 140.0 lb

## 2024-03-02 DIAGNOSIS — Z803 Family history of malignant neoplasm of breast: Secondary | ICD-10-CM | POA: Insufficient documentation

## 2024-03-02 DIAGNOSIS — D751 Secondary polycythemia: Secondary | ICD-10-CM | POA: Insufficient documentation

## 2024-03-02 DIAGNOSIS — E119 Type 2 diabetes mellitus without complications: Secondary | ICD-10-CM | POA: Diagnosis not present

## 2024-03-02 DIAGNOSIS — E785 Hyperlipidemia, unspecified: Secondary | ICD-10-CM | POA: Diagnosis not present

## 2024-03-02 DIAGNOSIS — I10 Essential (primary) hypertension: Secondary | ICD-10-CM | POA: Diagnosis not present

## 2024-03-02 DIAGNOSIS — F17210 Nicotine dependence, cigarettes, uncomplicated: Secondary | ICD-10-CM | POA: Diagnosis not present

## 2024-03-02 LAB — CBC (CANCER CENTER ONLY)
HCT: 53.3 % — ABNORMAL HIGH (ref 36.0–46.0)
Hemoglobin: 17.6 g/dL — ABNORMAL HIGH (ref 12.0–15.0)
MCH: 30.7 pg (ref 26.0–34.0)
MCHC: 33 g/dL (ref 30.0–36.0)
MCV: 93 fL (ref 80.0–100.0)
Platelet Count: 335 10*3/uL (ref 150–400)
RBC: 5.73 MIL/uL — ABNORMAL HIGH (ref 3.87–5.11)
RDW: 14.2 % (ref 11.5–15.5)
WBC Count: 10 10*3/uL (ref 4.0–10.5)
nRBC: 0 % (ref 0.0–0.2)

## 2024-03-02 LAB — IRON AND TIBC
Iron: 65 ug/dL (ref 28–170)
Saturation Ratios: 13 % (ref 10.4–31.8)
TIBC: 504 ug/dL — ABNORMAL HIGH (ref 250–450)
UIBC: 439 ug/dL

## 2024-03-02 LAB — FERRITIN: Ferritin: 12 ng/mL (ref 11–307)

## 2024-03-02 NOTE — Progress Notes (Signed)
 [facility]  [phone]) [phone] [phone]  ID: Angela Ballard OB: 1951/08/11  MR#: [id number]  ZDG#:[id number]  Patient Care Team: Lorrie Rothman, MD as PCP - General (Family Medicine) Shellie Dials, MD as Consulting Physician (Hematology and Oncology)  CHIEF COMPLAINT: Polycythemia.  INTERVAL HISTORY: Patient is a 73 year old female who was noted to have a persistently elevated hemoglobin on routine blood work.  She currently feels well and is asymptomatic.  She has no neurologic complaints.  She denies any recent fevers or illnesses.  She has a good appetite and denies weight loss.  She has no chest pain, shortness of breath, cough, or hemoptysis.  She denies any nausea, vomiting, constipation, or diarrhea.  She has no urinary complaints.  Patient feels at her baseline and offers no specific complaints today.  REVIEW OF SYSTEMS:   Review of Systems  Constitutional: Negative.  Negative for fever, malaise/fatigue and weight loss.  Respiratory: Negative.  Negative for cough, hemoptysis and shortness of breath.   Cardiovascular: Negative.  Negative for chest pain and leg swelling.  Gastrointestinal: Negative.  Negative for abdominal pain.  Genitourinary: Negative.  Negative for dysuria.  Musculoskeletal: Negative.  Negative for back pain.  Skin: Negative.  Negative for rash.  Neurological: Negative.  Negative for dizziness, focal weakness, weakness and headaches.  Psychiatric/Behavioral: Negative.  The patient is not nervous/anxious.     As per HPI. Otherwise, a complete review of systems is negative.  PAST MEDICAL HISTORY: Past Medical History:  Diagnosis Date   Diabetes mellitus without complication (HCC)    Family history of adverse reaction to anesthesia    GRANDFATHER-PASSED AWAY DURING SURGERY-UNSURE   Headache    H/O   Hyperlipidemia    Hypertension     PAST SURGICAL HISTORY: Past Surgical History:  Procedure Laterality Date    APPENDECTOMY     PAROTIDECTOMY Left 10/05/2016   Procedure: PAROTIDECTOMY;  Surgeon: Mellody Sprout, MD;  Location: [facility] ORS;  Service: ENT;  Laterality: Left;   TONSILECTOMY, ADENOIDECTOMY, BILATERAL MYRINGOTOMY AND TUBES  child   TONSILLECTOMY     WISDOM TOOTH EXTRACTION  teenager    FAMILY HISTORY: Family History  Problem Relation Age of Onset   Breast cancer Mother    Peripheral Artery Disease Sister    Epilepsy Brother     ADVANCED DIRECTIVES (Y/N):  N  HEALTH MAINTENANCE: Social History   Tobacco Use   Smoking status: Every Day    Current packs/day: 1.00    Average packs/day: 1 pack/day for 40.0 years (40.0 ttl pk-yrs)    Types: Cigarettes   Smokeless tobacco: Never  Substance Use Topics   Alcohol use: Yes    Comment: 2 beer daily   Drug use: No     Colonoscopy:  PAP:  Bone density:  Lipid panel:  Allergies  Allergen Reactions   Advil [Ibuprofen] Hives   Pravastatin Other (See Comments)    Leg cramps    Penicillins Rash    Has patient had a PCN reaction causing immediate rash, facial/tongue/throat swelling, SOB or lightheadedness with hypotension: No Has patient had a PCN reaction causing severe rash involving mucus membranes or skin necrosis: No Has patient had a PCN reaction that required hospitalization No Has patient had a PCN reaction occurring within the last 10 years: No If all of the above answers are "NO", then may proceed with Cephalosporin use.     Current Outpatient Medications  Medication Sig Dispense Refill   Biotin 95188 MCG TABS Take  10,000 mcg by mouth daily.     CINNAMON PO Take 1,000 mg by mouth 2 (two) times daily.     Garlic 1000 MG CAPS Take 1,000 mg by mouth 2 (two) times daily.     ibuprofen (ADVIL,MOTRIN) 200 MG tablet Take 200-400 mg by mouth every 6 (six) hours as needed for headache or moderate pain.     lisinopril -hydrochlorothiazide  (PRINZIDE ,ZESTORETIC ) 10-12.5 MG tablet Take 1 tablet by mouth daily.      Magnesium  500  MG TABS Take 500 mg by mouth daily as needed (cramps).     metFORMIN  (GLUCOPHAGE ) 500 MG tablet Take 500 mg by mouth 2 (two) times daily.      Multiple Vitamin (MULTI-VITAMINS) TABS Take 1 tablet by mouth daily.      Omega-3 Fatty Acids (FISH OIL) 1200 MG CAPS Take 1,200 mg by mouth 2 (two) times daily.     OVER THE COUNTER MEDICATION Take 400 mg by mouth daily. Berberine 400mg  tab     Red Yeast Rice 600 MG TABS Take 600 mg by mouth daily.     TURMERIC PO Take 800 mg by mouth daily.     Zinc  50 MG TABS Take 50 mg by mouth daily.     No current facility-administered medications for this visit.    OBJECTIVE: Vitals:   03/02/24 1333  BP: (!) 188/89  Pulse: 86  Resp: 16  Temp: 99 F (37.2 C)  SpO2: 97%     Body mass index is 21.93 kg/m.    ECOG FS:0 - Asymptomatic  General: Well-developed, well-nourished, no acute distress.  Sitting in a wheelchair. Eyes: Pink conjunctiva, anicteric sclera. HEENT: Normocephalic, moist mucous membranes. Lungs: No audible wheezing or coughing. Heart: Regular rate and rhythm. Abdomen: Soft, nontender, no obvious distention. Musculoskeletal: No edema, cyanosis, or clubbing. Neuro: Alert, answering all questions appropriately. Cranial nerves grossly intact. Skin: No rashes or petechiae noted. Psych: Normal affect. Lymphatics: No cervical, calvicular, axillary or inguinal LAD.   LAB RESULTS:  Lab Results  Component Value Date   NA 129 (L) 08/27/2016   K 4.1 08/27/2016   CL 95 (L) 08/27/2016   CO2 24 08/27/2016   GLUCOSE 147 (H) 08/27/2016   BUN 10 08/27/2016   CREATININE 0.67 08/27/2016   CALCIUM 9.6 08/27/2016   GFRNONAA >60 08/27/2016   GFRAA >60 08/27/2016    Lab Results  Component Value Date   WBC 10.0 03/02/2024   HGB 17.6 (H) 03/02/2024   HCT 53.3 (H) 03/02/2024   MCV 93.0 03/02/2024   PLT 335 03/02/2024   Lab Results  Component Value Date   IRON 65 03/02/2024   TIBC 504 (H) 03/02/2024   IRONPCTSAT 13 03/02/2024   Lab  Results  Component Value Date   FERRITIN 12 03/02/2024     STUDIES: No results found.  ASSESSMENT: Polycythemia.  PLAN:    Polycythemia: Patient's hemoglobin remains moderately elevated at 17.6, but her iron stores are within normal limits.  Possibly secondary to tobacco use but carbon monoxide levels are pending at time of dictation.  Have also ordered JAK2 mutation with reflex for completeness.  No intervention is needed at this time.  Patient will return to clinic in 3 weeks for further evaluation and discussion of her laboratory results. Hypertension: Patient's blood pressure is significantly elevated today.  Continue monitoring and treatment per primary care.  I spent a total of 45 minutes reviewing chart data, face-to-face evaluation with the patient, counseling and coordination of care as detailed above.  Patient expressed understanding and was in agreement with this plan. She also understands that She can call clinic at any time with any questions, concerns, or complaints.    Shellie Dials, MD   03/03/2024 8:54 AM

## 2024-03-03 LAB — CARBON MONOXIDE, BLOOD (PERFORMED AT REF LAB): Carbon Monoxide, Blood: 4.4 % — ABNORMAL HIGH (ref 0.0–3.6)

## 2024-03-05 LAB — ERYTHROPOIETIN: Erythropoietin: 7.6 m[IU]/mL (ref 2.6–18.5)

## 2024-03-06 ENCOUNTER — Ambulatory Visit: Admitting: Physical Therapy

## 2024-03-06 VITALS — BP 169/78 | HR 91

## 2024-03-06 DIAGNOSIS — M6281 Muscle weakness (generalized): Secondary | ICD-10-CM | POA: Diagnosis not present

## 2024-03-06 DIAGNOSIS — R262 Difficulty in walking, not elsewhere classified: Secondary | ICD-10-CM

## 2024-03-06 DIAGNOSIS — R2689 Other abnormalities of gait and mobility: Secondary | ICD-10-CM

## 2024-03-06 NOTE — Therapy (Unsigned)
 OUTPATIENT PHYSICAL THERAPY TREATMENT   Patient Name: OCCIE DELUCIA MRN: [id number] DOB:04-28-1951, 73 y.o., female Today's Date: 03/06/2024   PT End of Session - 03/06/24 1332     Visit Number 3    Number of Visits 21    Date for PT Re-Evaluation 05/08/24    Authorization Type [employer] 2025, VL based on auth    PT Start Time 1329    PT Stop Time 1410    PT Time Calculation (min) 41 min    Equipment Utilized During Treatment Gait belt    Activity Tolerance Patient tolerated treatment well    Behavior During Therapy WFL for tasks assessed/performed             Past Medical History:  Diagnosis Date   Diabetes mellitus without complication (HCC)    Family history of adverse reaction to anesthesia    GRANDFATHER-PASSED AWAY DURING SURGERY-UNSURE   Headache    H/O   Hyperlipidemia    Hypertension    Past Surgical History:  Procedure Laterality Date   APPENDECTOMY     PAROTIDECTOMY Left 10/05/2016   Procedure: PAROTIDECTOMY;  Surgeon: Mellody Sprout, MD;  Location: [facility] ORS;  Service: ENT;  Laterality: Left;   TONSILECTOMY, ADENOIDECTOMY, BILATERAL MYRINGOTOMY AND TUBES  child   TONSILLECTOMY     WISDOM TOOTH EXTRACTION  teenager   Patient Active Problem List   Diagnosis Date Noted   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   Neck mass 08/27/2016   Controlled type 2 diabetes mellitus without complication, without long-term current use of insulin  (HCC) 06/21/2015   Episodic low back pain 06/15/2014   Benign essential hypertension 03/09/2014   Pure hypercholesterolemia 03/09/2014   Type 2 diabetes mellitus, controlled (HCC) 03/09/2014    PCP: Lorrie Rothman, MD  REFERRING PROVIDER: Lorrie Rothman, MD  REFERRING DIAGNOSIS:  R26.81 (ICD-10-CM) - Unsteadiness on feet  R53.1 (ICD-10-CM) - Weakness    THERAPY DIAG: Muscle weakness (generalized)  Difficulty in walking, not elsewhere classified  Imbalance  ONSET DATE: Over previous year, worsening  balance/stability  FOLLOW UP APPT WITH PROVIDER: Yes ; f/u on 04/11/24   RATIONALE FOR EVALUATION AND TREATMENT: Rehabilitation  Pertinent History Pt is a 74 year old female referred for unsteadiness and weakness by her PCP. Hx of multiple falls. Pt was also referred to hematology to check hemoglobin. Pt is following up with hematology this Thursday. Pt reports having a lot of falls - "every few weeks here lately." Pt reports fear related to falling. Pt reports falling in street and having to ask neighbors for help to get up; she tripped on loose gravel (this happened 2 weeks ago). Patient reports no hx of neuropathy; pt reports her feet feel "strange" or feel as if she is dragging while she is walking. Pt reports some loss of appetite and weight loss. Patient reports some atrophy in her legs. Pt denies night pain/disturbed sleep. Pt reports some urinary frequency at night time.    Pain: No Numbness/Tingling: No Focal Weakness: Yes; weakness in legs specifically Recent changes in overall health/medication: Yes; pt cutting back on Metformin  dosage per PCP, discontinued cholesterol medication Prior history of physical therapy for balance:  No Falls: Has patient fallen in last 6 months? Yes, Number of falls: 10+ Directional pattern for falls: Yes, loses pattern in home more going backward  Imaging: No  Prior level of function: Independent with basic ADLs Occupational demands: Retired  Hobbies: Draw (pt doesn't have as much interest)  Red flags (bowel/bladder changes, saddle paresthesia, personal  history of cancer, h/o spinal tumors, h/o compression fx, h/o abdominal aneurysm, abdominal pain, chills/fever, night sweats, nausea, vomiting, unrelenting pain): Negative  Precautions: Fall history  Weight Bearing Restrictions: No  Living Environment Lives with: lives alone; daughter comes (she lives 45 mins away) Lives in: House/apartment; 6 steps to get into home, handrails on both sides (can  reach both); grab bar for tub. Grass walkway up to steps.  Has following equipment at home: Otho Blitz - 2 wheeled, quad cane, other cane that pt does not feel safe with     Patient Goals: "Get some stability"    OBJECTIVE:   Patient Surveys  ABC: 60%   Gross Musculoskeletal Assessment Tremor: None Bulk: Atrophy in R>L quads Tone: Normal R hip hump, L scapula sits higher than R   GAIT: Distance walked: 80 ft Assistive device utilized: None Level of assistance: CGA Comments: Shortened step/stride length bilaterally, decreased heel strike at initial contact, maintains forward flexed posture throughout gait cycle, decreased terminal knee extension with terminal swing, decreased arm swing and truncal rigidity  Posture: Forward flexed posture in sitting and standing    LE MMT:  MMT (out of 5) Right 02/28/2024 Left 02/28/2024  Hip flexion 4- 4-  Hip extension    Hip abduction (seated) 4 4  Hip adduction (seated)  5 5  Hip internal rotation    Hip external rotation    Knee flexion 4+ 4  Knee extension 4+ 4+  Ankle dorsiflexion 4- 4-  Ankle plantarflexion    Ankle inversion    Ankle eversion    (* = pain; Blank rows = not tested)  Coordination/Cerebellar Finger to Nose: WNL Heel to Shin: WNL Rapid alternating movements: Impaired UEs, WNL LE Finger Opposition: WNL Pronator Drift: Negative   Postural Control Romberg EO/EC WNL Sharpened Romberg: 7 sec   FUNCTIONAL OUTCOME MEASURES   Results Comments  BERG 42/56   DGI 12/24   TUG 24.8 seconds Fall risk, in need of intervention  5TSTS Unable to complete sit to stand in standard position Fall risk, in need of intervention  (Blank rows = not tested)    TODAY'S TREATMENT    Vitals:   03/06/24 1336  BP: (!) 169/78  Pulse: 91  SpO2: 97%     SUBJECTIVE STATEMENT:   Patient reports ongoing f/u with hematology. Patient reports feeling somewhat better this week. Patient reports seeing gradual improvements and  confidence with her walking. Pt reports doing well with initial HEP.    Therapeutic Exercise - improved strength as needed to improve performance of CKC activities/functional movements and as needed for power production to prevent fall during episode of large postural perturbation  NuStep; Level 3, x 5 minutes - for improved soft tissue mobility and increased tissue temperature to improve muscle performance   -subjective gathered during this time  Standing march, in bars; x20 alternating R/L  PATIENT EDUCATION: HEP update and review. Encouraged use of AD for community-level gait and with navigating uneven/challenging terrain. Discussed BP management and continued monitoring each day; discussed f/u with MD if BP remains high with repeated measures.   *not today* Sit to stand; reviewed for HEP Standing march, at counter; 2 x 10 alternating R/L Standing HR/TR; reviewed for HEP   Neuromuscular Re-education - for improved sensory integration, static and dynamic postural control, equilibrium and non-equilibrium coordination as needed for negotiating home and community environment and stepping over obstacles  Toe tapping; 6-inch step; 2 x 10 alternatin  Target stepping on blue agility ladder;  4x D/B, encouraging heel strike and full step length  Hurdle stepping with 6-inch hurdles attempted; poor toe clearance with trail leg Hurdle stepping with 1x4 wooden boards; step-to pattern; 4x D/B with RLE and LLE leading  3-way hip, in standing; 2 x 5 ea dir, bilat  -added to HEP     PATIENT EDUCATION:  Education details: see above for patient education details Person educated: Patient Education method: Explanation and Demonstration Education comprehension: verbalized understanding and returned demonstration   HOME EXERCISE PROGRAM: Access Code: [email or web address] URL: [email or web address] Date: 03/06/2024 Prepared by: Denese Finn  Exercises - Sit to Stand with Hands on Knees   - 2 x daily - 7 x weekly - 2 sets - 5-10 reps - Heel Toe Raises with Counter Support  - 2 x daily - 7 x weekly - 2 sets - 10 reps - Standing March with Counter Support  - 2 x daily - 7 x weekly - 2 sets - 10 reps - Standing 3-Way Kick  - 2 x daily - 7 x weekly - 2 sets - 5-6 reps   ASSESSMENT:  CLINICAL IMPRESSION: Pt is able to complete greater-length steps and small hurdle steps with sound toe clearance. She is notably challenge with attempted 6-inch hurdle steps with difficulty c trailing limb not clearing hurdle and toe catching hurdle. Pt does not have any notable decrease with oxygen saturation, and blood pressure is not to level of hypertensive urgency today. Pt will continue monitoring BP at home and f/u with PCP regarding BP medical management. Pt needs further work on balance, strengthening, and gait re-training. Patient will benefit from skilled PT to address above impairments and improve overall function.  REHAB POTENTIAL: Good  CLINICAL DECISION MAKING: Evolving/moderate complexity  EVALUATION COMPLEXITY: Moderate   GOALS: Goals reviewed with patient? No  SHORT TERM GOALS: Target date: 03/20/2024  Pt will be independent with HEP in order to improve strength and balance in order to decrease fall risk and improve function at home. Baseline: 02/28/24: Baseline HEP to be initiated on visit #2; discussed use of sit to stand (with UE assist prn) as exercise to reduce fall risk.  Goal status: INITIAL  Pt will perform independent sit to stand with no upper extremity support indicative of improved LE strength/power as needed to prevent fall and improved capacity for independent transferring Baseline: 02/28/24: Unable to complete sit to stand without UE assist.  Goal status: INITIAL  LONG TERM GOALS: Target date: 05/11/2024  Pt will negotiate 6 stairs with 1-2 handrail support safely with use of LRAD and no LOB as needed for entering/exiting home Baseline: 02/28/24: Has to use stairs  for entering/exiting home, pt prone to tripping/falling.  Goal status: INITIAL  2.  Pt will improve BERG by at least 3 points in order to demonstrate clinically significant improvement in balance.   Baseline: 02/28/24: To be obtained on visit # 2.    03/01/24: 42/56 Goal status: INITIAL  3.  Pt will improve ABC by at least 13% in order to demonstrate clinically significant improvement in balance confidence.      Baseline: 02/28/24: 60% Goal status: INITIAL  5. Pt will improve DGI by at least 3 points in order to demonstrate clinically significant improvement in balance and decreased risk for falls.     Baseline: 02/28/24: To be obtained on visit # 2.   03/01/24: 12/24 Goal status: INITIAL  6. Pt will decrease TUG to below 14 seconds/decrease in order to demonstrate decreased fall risk.  Baseline:  02/28/24: 24.8 sec  Goal status: INITIAL    PLAN: PT FREQUENCY: 2x/week  PT DURATION: 12 weeks  PLANNED INTERVENTIONS: Therapeutic exercises, Therapeutic activity, Neuromuscular re-education, Balance training, Gait training, Patient/Family education, Joint manipulation, Joint mobilization, Canalith repositioning, Aquatic Therapy, Dry Needling, Cognitive remediation, Electrical stimulation, Spinal manipulation, Spinal mobilization, Cryotherapy, Moist heat, Traction, Ultrasound, Ionotophoresis 4mg /ml Dexamethasone , and Manual therapy  PLAN FOR NEXT SESSION:  Continue with LE strengthening and balance training, weight shifting/toe tapping. Movement control training for step length/heel strike. Update HEP with successive PT visits.   Denese Finn, PT, DPT #[id number]  Aleatha Hunting 03/06/2024, 1:37 PM

## 2024-03-07 ENCOUNTER — Encounter: Payer: Self-pay | Admitting: Physical Therapy

## 2024-03-08 ENCOUNTER — Ambulatory Visit: Admitting: Physical Therapy

## 2024-03-08 VITALS — HR 92

## 2024-03-08 DIAGNOSIS — R262 Difficulty in walking, not elsewhere classified: Secondary | ICD-10-CM

## 2024-03-08 DIAGNOSIS — R2689 Other abnormalities of gait and mobility: Secondary | ICD-10-CM | POA: Diagnosis not present

## 2024-03-08 DIAGNOSIS — M6281 Muscle weakness (generalized): Secondary | ICD-10-CM | POA: Diagnosis not present

## 2024-03-08 NOTE — Therapy (Signed)
 OUTPATIENT PHYSICAL THERAPY TREATMENT   Patient Name: Angela Ballard MRN: [id number] DOB:[birth year], 73 y.o., female Today's Date: 03/08/2024   PT End of Session - 03/08/24 1236     Visit Number 4    Number of Visits 21    Date for PT Re-Evaluation 05/08/24    Authorization Type [employer] 2025, VL based on auth    PT Start Time 1239    PT Stop Time 1323    PT Time Calculation (min) 44 min    Equipment Utilized During Treatment Gait belt    Activity Tolerance Patient tolerated treatment well    Behavior During Therapy WFL for tasks assessed/performed              Past Medical History:  Diagnosis Date   Diabetes mellitus without complication (HCC)    Family history of adverse reaction to anesthesia    GRANDFATHER-PASSED AWAY DURING SURGERY-UNSURE   Headache    H/O   Hyperlipidemia    Hypertension    Past Surgical History:  Procedure Laterality Date   APPENDECTOMY     PAROTIDECTOMY Left 10/05/2016   Procedure: PAROTIDECTOMY;  Surgeon: Mellody Sprout, MD;  Location: [facility] ORS;  Service: ENT;  Laterality: Left;   TONSILECTOMY, ADENOIDECTOMY, BILATERAL MYRINGOTOMY AND TUBES  child   TONSILLECTOMY     WISDOM TOOTH EXTRACTION  teenager   Patient Active Problem List   Diagnosis Date Noted   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   Neck mass 08/27/2016   Controlled type 2 diabetes mellitus without complication, without long-term current use of insulin  (HCC) 06/21/2015   Episodic low back pain 06/15/2014   Benign essential hypertension 03/09/2014   Pure hypercholesterolemia 03/09/2014   Type 2 diabetes mellitus, controlled (HCC) 03/09/2014    PCP: Lorrie Rothman, MD  REFERRING PROVIDER: Lorrie Rothman, MD  REFERRING DIAGNOSIS:  R26.81 (ICD-10-CM) - Unsteadiness on feet  R53.1 (ICD-10-CM) - Weakness    THERAPY DIAG: Muscle weakness (generalized)  Difficulty in walking, not elsewhere classified  Imbalance  ONSET DATE: Over previous year, worsening  balance/stability  FOLLOW UP APPT WITH PROVIDER: Yes ; f/u on 04/11/24   RATIONALE FOR EVALUATION AND TREATMENT: Rehabilitation  Pertinent History Pt is a 73 year old female referred for unsteadiness and weakness by her PCP. Hx of multiple falls. Pt was also referred to hematology to check hemoglobin. Pt is following up with hematology this Thursday. Pt reports having a lot of falls - "every few weeks here lately." Pt reports fear related to falling. Pt reports falling in street and having to ask neighbors for help to get up; she tripped on loose gravel (this happened 2 weeks ago). Patient reports no hx of neuropathy; pt reports her feet feel "strange" or feel as if she is dragging while she is walking. Pt reports some loss of appetite and weight loss. Patient reports some atrophy in her legs. Pt denies night pain/disturbed sleep. Pt reports some urinary frequency at night time.    Pain: No Numbness/Tingling: No Focal Weakness: Yes; weakness in legs specifically Recent changes in overall health/medication: Yes; pt cutting back on Metformin  dosage per PCP, discontinued cholesterol medication Prior history of physical therapy for balance:  No Falls: Has patient fallen in last 6 months? Yes, Number of falls: 10+ Directional pattern for falls: Yes, loses pattern in home more going backward  Imaging: No  Prior level of function: Independent with basic ADLs Occupational demands: Retired  Hobbies: Draw (pt doesn't have as much interest)  Red flags (bowel/bladder changes, saddle paresthesia,  personal history of cancer, h/o spinal tumors, h/o compression fx, h/o abdominal aneurysm, abdominal pain, chills/fever, night sweats, nausea, vomiting, unrelenting pain): Negative  Precautions: Fall history  Weight Bearing Restrictions: No  Living Environment Lives with: lives alone; daughter comes (she lives 45 mins away) Lives in: House/apartment; 6 steps to get into home, handrails on both sides (can  reach both); grab bar for tub. Grass walkway up to steps.  Has following equipment at home: Otho Blitz - 2 wheeled, quad cane, other cane that pt does not feel safe with     Patient Goals: "Get some stability"    OBJECTIVE:   Patient Surveys  ABC: 60%   Gross Musculoskeletal Assessment Tremor: None Bulk: Atrophy in R>L quads Tone: Normal R hip hump, L scapula sits higher than R   GAIT: Distance walked: 80 ft Assistive device utilized: None Level of assistance: CGA Comments: Shortened step/stride length bilaterally, decreased heel strike at initial contact, maintains forward flexed posture throughout gait cycle, decreased terminal knee extension with terminal swing, decreased arm swing and truncal rigidity  Posture: Forward flexed posture in sitting and standing    LE MMT:  MMT (out of 5) Right 02/28/2024 Left 02/28/2024  Hip flexion 4- 4-  Hip extension    Hip abduction (seated) 4 4  Hip adduction (seated)  5 5  Hip internal rotation    Hip external rotation    Knee flexion 4+ 4  Knee extension 4+ 4+  Ankle dorsiflexion 4- 4-  Ankle plantarflexion    Ankle inversion    Ankle eversion    (* = pain; Blank rows = not tested)  Coordination/Cerebellar Finger to Nose: WNL Heel to Shin: WNL Rapid alternating movements: Impaired UEs, WNL LE Finger Opposition: WNL Pronator Drift: Negative   Postural Control Romberg EO/EC WNL Sharpened Romberg: 7 sec   FUNCTIONAL OUTCOME MEASURES   Results Comments  BERG 42/56   DGI 12/24   TUG 24.8 seconds Fall risk, in need of intervention  5TSTS Unable to complete sit to stand in standard position Fall risk, in need of intervention  (Blank rows = not tested)    TODAY'S TREATMENT    Vitals:   03/08/24 1243  Pulse: 92  SpO2: 96%      SUBJECTIVE STATEMENT:   Patient reports she follows up with hematology next week. Pt reports no new concerns or safety issue since last visit. She feels she is beginning to get better.     Therapeutic Exercise - improved strength as needed to improve performance of CKC activities/functional movements and as needed for power production to prevent fall during episode of large postural perturbation  NuStep; Level 3, x 5 minutes - for improved soft tissue mobility and increased tissue temperature to improve muscle performance   -subjective gathered during this time  Dynamic march, in bars; x 2 D/B alternating R/L  PATIENT EDUCATION: HEP reviewed; discussed goals of PT and encouraged continued at-home BP monitoring.    *not today* Sit to stand; reviewed for HEP Standing march, at counter; 2 x 10 alternating R/L Standing HR/TR; reviewed for HEP   Neuromuscular Re-education - for improved sensory integration, static and dynamic postural control, equilibrium and non-equilibrium coordination as needed for negotiating home and community environment and stepping over obstacles  Toe tapping; 6-inch step; 2 x 10 alternating  Hurdle stepping with 1x4 wooden boards; step-to pattern; 4x D/B with RLE and LLE leading  Sit to stand with overhead unweighed ball reach; 2 x 8  Target stepping  on blue agility ladder; 4x D/B, encouraging heel strike and full step length    *not today* 3-way hip, in standing; 2 x 5 ea dir, bilat Hurdle stepping with 6-inch hurdles attempted; poor toe clearance with trail leg     PATIENT EDUCATION:  Education details: see above for patient education details Person educated: Patient Education method: Explanation and Demonstration Education comprehension: verbalized understanding and returned demonstration   HOME EXERCISE PROGRAM: Access Code: [email or web address] URL: [email or web address] Date: 03/06/2024 Prepared by: Denese Finn  Exercises - Sit to Stand with Hands on Knees  - 2 x daily - 7 x weekly - 2 sets - 5-10 reps - Heel Toe Raises with Counter Support  - 2 x daily - 7 x weekly - 2 sets - 10 reps - Standing March with  Counter Support  - 2 x daily - 7 x weekly - 2 sets - 10 reps - Standing 3-Way Kick  - 2 x daily - 7 x weekly - 2 sets - 5-6 reps   ASSESSMENT:  CLINICAL IMPRESSION: Pt is notably challenged with exercises requiring brief unipedal stance and exhibits notably flexed posture (thoracolumbar, hip, knee flexion) during standing/stepping. We discussed postural correction and aiming for heel strike at initial contact, and pt is able to reproduce this fairly well. Pt exhibits markedly improved sit to stand with less UE support. Pt needs further work on balance, strengthening, and gait re-training. Patient will benefit from skilled PT to address above impairments and improve overall function.  REHAB POTENTIAL: Good  CLINICAL DECISION MAKING: Evolving/moderate complexity  EVALUATION COMPLEXITY: Moderate   GOALS: Goals reviewed with patient? No  SHORT TERM GOALS: Target date: 03/20/2024  Pt will be independent with HEP in order to improve strength and balance in order to decrease fall risk and improve function at home. Baseline: 02/28/24: Baseline HEP to be initiated on visit #2; discussed use of sit to stand (with UE assist prn) as exercise to reduce fall risk.  Goal status: INITIAL  Pt will perform independent sit to stand with no upper extremity support indicative of improved LE strength/power as needed to prevent fall and improved capacity for independent transferring Baseline: 02/28/24: Unable to complete sit to stand without UE assist.  Goal status: INITIAL  LONG TERM GOALS: Target date: 05/11/2024  Pt will negotiate 6 stairs with 1-2 handrail support safely with use of LRAD and no LOB as needed for entering/exiting home Baseline: 02/28/24: Has to use stairs for entering/exiting home, pt prone to tripping/falling.  Goal status: INITIAL  2.  Pt will improve BERG by at least 3 points in order to demonstrate clinically significant improvement in balance.   Baseline: 02/28/24: To be obtained on visit  # 2.    03/01/24: 42/56 Goal status: INITIAL  3.  Pt will improve ABC by at least 13% in order to demonstrate clinically significant improvement in balance confidence.      Baseline: 02/28/24: 60% Goal status: INITIAL  5. Pt will improve DGI by at least 3 points in order to demonstrate clinically significant improvement in balance and decreased risk for falls.     Baseline: 02/28/24: To be obtained on visit # 2.   03/01/24: 12/24 Goal status: INITIAL  6. Pt will decrease TUG to below 14 seconds/decrease in order to demonstrate decreased fall risk.  Baseline: 02/28/24: 24.8 sec  Goal status: INITIAL    PLAN: PT FREQUENCY: 2x/week  PT DURATION: 12 weeks  PLANNED INTERVENTIONS: Therapeutic exercises, Therapeutic activity, Neuromuscular re-education, Balance training, Gait  training, Patient/Family education, Joint manipulation, Joint mobilization, Canalith repositioning, Aquatic Therapy, Dry Needling, Cognitive remediation, Electrical stimulation, Spinal manipulation, Spinal mobilization, Cryotherapy, Moist heat, Traction, Ultrasound, Ionotophoresis 4mg /ml Dexamethasone , and Manual therapy  PLAN FOR NEXT SESSION:  Continue with LE strengthening and balance training, weight shifting/toe tapping. Movement control training for step length/heel strike. Update HEP with successive PT visits.   Denese Finn, PT, DPT #[id number]  Aleatha Hunting 03/08/2024, 1:25 PM

## 2024-03-09 LAB — JAK2 V617F RFX CALR/MPL/E12-15

## 2024-03-09 LAB — CALR +MPL + E12-E15  (REFLEX)

## 2024-03-13 ENCOUNTER — Ambulatory Visit: Admitting: Physical Therapy

## 2024-03-13 DIAGNOSIS — R2689 Other abnormalities of gait and mobility: Secondary | ICD-10-CM

## 2024-03-13 DIAGNOSIS — R262 Difficulty in walking, not elsewhere classified: Secondary | ICD-10-CM | POA: Diagnosis not present

## 2024-03-13 DIAGNOSIS — M6281 Muscle weakness (generalized): Secondary | ICD-10-CM

## 2024-03-13 NOTE — Therapy (Signed)
 OUTPATIENT PHYSICAL THERAPY TREATMENT   Patient Name: Angela Ballard MRN: [id number] DOB:12/15/1950, 73 y.o., female Today's Date: 03/13/2024   PT End of Session - 03/13/24 1317     Visit Number 5    Number of Visits 21    Date for PT Re-Evaluation 05/08/24    Authorization Type [employer] 2025, VL based on auth    PT Start Time 1315    PT Stop Time 1404    PT Time Calculation (min) 49 min    Equipment Utilized During Treatment Gait belt    Activity Tolerance Patient tolerated treatment well    Behavior During Therapy WFL for tasks assessed/performed               Past Medical History:  Diagnosis Date   Diabetes mellitus without complication (HCC)    Family history of adverse reaction to anesthesia    GRANDFATHER-PASSED AWAY DURING SURGERY-UNSURE   Headache    H/O   Hyperlipidemia    Hypertension    Past Surgical History:  Procedure Laterality Date   APPENDECTOMY     PAROTIDECTOMY Left 10/05/2016   Procedure: PAROTIDECTOMY;  Surgeon: Mellody Sprout, MD;  Location: [facility];  Service: ENT;  Laterality: Left;   TONSILECTOMY, ADENOIDECTOMY, BILATERAL MYRINGOTOMY AND TUBES  child   TONSILLECTOMY     WISDOM TOOTH EXTRACTION  teenager   Patient Active Problem List   Diagnosis Date Noted   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   Neck mass 08/27/2016   Controlled type 2 diabetes mellitus without complication, without long-term current use of insulin  (HCC) 06/21/2015   Episodic low back pain 06/15/2014   Benign essential hypertension 03/09/2014   Pure hypercholesterolemia 03/09/2014   Type 2 diabetes mellitus, controlled (HCC) 03/09/2014    PCP: Lorrie Rothman, MD  REFERRING PROVIDER: Lorrie Rothman, MD  REFERRING DIAGNOSIS:  R26.81 (ICD-10-CM) - Unsteadiness on feet  R53.1 (ICD-10-CM) - Weakness    THERAPY DIAG: Muscle weakness (generalized)  Difficulty in walking, not elsewhere classified  Imbalance  ONSET DATE: Over previous year, worsening  balance/stability  FOLLOW UP APPT WITH PROVIDER: Yes ; f/u on 04/11/24   RATIONALE FOR EVALUATION AND TREATMENT: Rehabilitation  Pertinent History Pt is a 73 year old female referred for unsteadiness and weakness by her PCP. Hx of multiple falls. Pt was also referred to hematology to check hemoglobin. Pt is following up with hematology this Thursday. Pt reports having a lot of falls - "every few weeks here lately." Pt reports fear related to falling. Pt reports falling in street and having to ask neighbors for help to get up; she tripped on loose gravel (this happened 2 weeks ago). Patient reports no hx of neuropathy; pt reports her feet feel "strange" or feel as if she is dragging while she is walking. Pt reports some loss of appetite and weight loss. Patient reports some atrophy in her legs. Pt denies night pain/disturbed sleep. Pt reports some urinary frequency at night time.    Pain: No Numbness/Tingling: No Focal Weakness: Yes; weakness in legs specifically Recent changes in overall health/medication: Yes; pt cutting back on Metformin  dosage per PCP, discontinued cholesterol medication Prior history of physical therapy for balance:  No Falls: Has patient fallen in last 6 months? Yes, Number of falls: 10+ Directional pattern for falls: Yes, loses pattern in home more going backward  Imaging: No  Prior level of function: Independent with basic ADLs Occupational demands: Retired  Hobbies: Draw (pt doesn't have as much interest)  Red flags (bowel/bladder changes, saddle  paresthesia, personal history of cancer, h/o spinal tumors, h/o compression fx, h/o abdominal aneurysm, abdominal pain, chills/fever, night sweats, nausea, vomiting, unrelenting pain): Negative  Precautions: Fall history  Weight Bearing Restrictions: No  Living Environment Lives with: lives alone; daughter comes (she lives 45 mins away) Lives in: House/apartment; 6 steps to get into home, handrails on both sides (can  reach both); grab bar for tub. Grass walkway up to steps.  Has following equipment at home: Otho Blitz - 2 wheeled, quad cane, other cane that pt does not feel safe with     Patient Goals: "Get some stability"    OBJECTIVE:   Patient Surveys  ABC: 60%   Gross Musculoskeletal Assessment Tremor: None Bulk: Atrophy in R>L quads Tone: Normal R hip hump, L scapula sits higher than R   GAIT: Distance walked: 80 ft Assistive device utilized: None Level of assistance: CGA Comments: Shortened step/stride length bilaterally, decreased heel strike at initial contact, maintains forward flexed posture throughout gait cycle, decreased terminal knee extension with terminal swing, decreased arm swing and truncal rigidity  Posture: Forward flexed posture in sitting and standing    LE MMT:  MMT (out of 5) Right 02/28/2024 Left 02/28/2024  Hip flexion 4- 4-  Hip extension    Hip abduction (seated) 4 4  Hip adduction (seated)  5 5  Hip internal rotation    Hip external rotation    Knee flexion 4+ 4  Knee extension 4+ 4+  Ankle dorsiflexion 4- 4-  Ankle plantarflexion    Ankle inversion    Ankle eversion    (* = pain; Blank rows = not tested)  Coordination/Cerebellar Finger to Nose: WNL Heel to Shin: WNL Rapid alternating movements: Impaired UEs, WNL LE Finger Opposition: WNL Pronator Drift: Negative   Postural Control Romberg EO/EC WNL Sharpened Romberg: 7 sec   FUNCTIONAL OUTCOME MEASURES   Results Comments  BERG 42/56   DGI 12/24   TUG 24.8 seconds Fall risk, in need of intervention  5TSTS Unable to complete sit to stand in standard position Fall risk, in need of intervention  (Blank rows = not tested)    TODAY'S TREATMENT    There were no vitals filed for this visit.     SUBJECTIVE STATEMENT:   Patient is still awaiting follow-up with hematology next week. She got her labs back, but no word from MD yet. No new complaints or recent safety issues. Pt reports BP  in 160s for SBP this AM and DBP in 80s.    Therapeutic Exercise - improved strength as needed to improve performance of CKC activities/functional movements and as needed for power production to prevent fall during episode of large postural perturbation  NuStep; Level 4, x 5 minutes - for improved soft tissue mobility and increased tissue temperature to improve muscle performance   -subjective gathered during this time  Dynamic march, in bars; x 2 D/B alternating R/L  PATIENT EDUCATION: Encouraged continued HEP and monitoring BP at home.    *not today* Sit to stand; reviewed for HEP Standing march, at counter; 2 x 10 alternating R/L Standing HR/TR; reviewed for HEP   Neuromuscular Re-education - for improved sensory integration, static and dynamic postural control, equilibrium and non-equilibrium coordination as needed for negotiating home and community environment and stepping over obstacles  Target stepping on blue agility ladder; 4x D/B, encouraging heel strike and full step length  Toe tapping; 6-inch step; 2 x 10 alternating  Hurdle stepping with 1x4 wooden boards; step-to pattern; 4x  D/B with RLE and LLE leading  Sit to stand with 4.4-lb Medball reach; 2 x 8   Obstacle course; Airex pad step up/down x 3 and 2 ankle weights step-over (weights flat on ground); x3 D/B   Tandem stance attempted, unable to maintain postural control without touching bars   Semitandem stance; x 30 on each side   *not today* 3-way hip, in standing; 2 x 5 ea dir, bilat Hurdle stepping with 6-inch hurdles attempted; poor toe clearance with trail leg     PATIENT EDUCATION:  Education details: see above for patient education details Person educated: Patient Education method: Explanation and Demonstration Education comprehension: verbalized understanding and returned demonstration   HOME EXERCISE PROGRAM: Access Code: [email or web address] URL: [email or web address] Date:  03/06/2024 Prepared by: Denese Finn  Exercises - Sit to Stand with Hands on Knees  - 2 x daily - 7 x weekly - 2 sets - 5-10 reps - Heel Toe Raises with Counter Support  - 2 x daily - 7 x weekly - 2 sets - 10 reps - Standing March with Counter Support  - 2 x daily - 7 x weekly - 2 sets - 10 reps - Standing 3-Way Kick  - 2 x daily - 7 x weekly - 2 sets - 5-6 reps   ASSESSMENT:  CLINICAL IMPRESSION: Patient demonstrates vastly improved step length and heel striking pattern. She does have ongoing forward flexed posture which may persist due to skeletal changes in spine. She is notably challenged with postural control with Tandem/sharpened Romberg position. Pt is able to maintain semitandem for 30 sec on each side today. Pt needs further work on balance, strengthening, and gait re-training. Patient will benefit from skilled PT to address above impairments and improve overall function.  REHAB POTENTIAL: Good  CLINICAL DECISION MAKING: Evolving/moderate complexity  EVALUATION COMPLEXITY: Moderate   GOALS: Goals reviewed with patient? No  SHORT TERM GOALS: Target date: 03/20/2024  Pt will be independent with HEP in order to improve strength and balance in order to decrease fall risk and improve function at home. Baseline: 02/28/24: Baseline HEP to be initiated on visit #2; discussed use of sit to stand (with UE assist prn) as exercise to reduce fall risk.  Goal status: INITIAL  Pt will perform independent sit to stand with no upper extremity support indicative of improved LE strength/power as needed to prevent fall and improved capacity for independent transferring Baseline: 02/28/24: Unable to complete sit to stand without UE assist.  Goal status: INITIAL  LONG TERM GOALS: Target date: 05/11/2024  Pt will negotiate 6 stairs with 1-2 handrail support safely with use of LRAD and no LOB as needed for entering/exiting home Baseline: 02/28/24: Has to use stairs for entering/exiting home, pt  prone to tripping/falling.  Goal status: INITIAL  2.  Pt will improve BERG by at least 3 points in order to demonstrate clinically significant improvement in balance.   Baseline: 02/28/24: To be obtained on visit # 2.    03/01/24: 42/56 Goal status: INITIAL  3.  Pt will improve ABC by at least 13% in order to demonstrate clinically significant improvement in balance confidence.      Baseline: 02/28/24: 60% Goal status: INITIAL  5. Pt will improve DGI by at least 3 points in order to demonstrate clinically significant improvement in balance and decreased risk for falls.     Baseline: 02/28/24: To be obtained on visit # 2.   03/01/24: 12/24 Goal status: INITIAL  6. Pt will decrease TUG to  below 14 seconds/decrease in order to demonstrate decreased fall risk.  Baseline: 02/28/24: 24.8 sec  Goal status: INITIAL    PLAN: PT FREQUENCY: 2x/week  PT DURATION: 12 weeks  PLANNED INTERVENTIONS: Therapeutic exercises, Therapeutic activity, Neuromuscular re-education, Balance training, Gait training, Patient/Family education, Joint manipulation, Joint mobilization, Canalith repositioning, Aquatic Therapy, Dry Needling, Cognitive remediation, Electrical stimulation, Spinal manipulation, Spinal mobilization, Cryotherapy, Moist heat, Traction, Ultrasound, Ionotophoresis 4mg /ml Dexamethasone , and Manual therapy  PLAN FOR NEXT SESSION:  Continue with LE strengthening and balance training, weight shifting/toe tapping. Movement control training for step length/heel strike. Update HEP with successive PT visits.   Denese Finn, PT, DPT #[id number]  Aleatha Hunting 03/13/2024, 2:09 PM

## 2024-03-14 NOTE — Therapy (Signed)
 OUTPATIENT PHYSICAL THERAPY TREATMENT   Patient Name: Angela Ballard MRN: [id number] DOB:[birth year], 73 y.o., female Today's Date: 03/15/2024   PT End of Session - 03/15/24 1327     Visit Number 6    Number of Visits 21    Date for PT Re-Evaluation 05/08/24    Authorization Type [employer] 2025, VL based on auth    PT Start Time 1330    PT Stop Time 1409    PT Time Calculation (min) 39 min    Equipment Utilized During Treatment Gait belt    Activity Tolerance Patient tolerated treatment well    Behavior During Therapy WFL for tasks assessed/performed                Past Medical History:  Diagnosis Date   Diabetes mellitus without complication (HCC)    Family history of adverse reaction to anesthesia    GRANDFATHER-PASSED AWAY DURING SURGERY-UNSURE   Headache    H/O   Hyperlipidemia    Hypertension    Past Surgical History:  Procedure Laterality Date   APPENDECTOMY     PAROTIDECTOMY Left 10/05/2016   Procedure: PAROTIDECTOMY;  Surgeon: Mellody Sprout, MD;  Location: [facility] ORS;  Service: ENT;  Laterality: Left;   TONSILECTOMY, ADENOIDECTOMY, BILATERAL MYRINGOTOMY AND TUBES  child   TONSILLECTOMY     WISDOM TOOTH EXTRACTION  teenager   Patient Active Problem List   Diagnosis Date Noted   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   Neck mass 08/27/2016   Controlled type 2 diabetes mellitus without complication, without long-term current use of insulin  (HCC) 06/21/2015   Episodic low back pain 06/15/2014   Benign essential hypertension 03/09/2014   Pure hypercholesterolemia 03/09/2014   Type 2 diabetes mellitus, controlled (HCC) 03/09/2014    PCP: Lorrie Rothman, MD  REFERRING PROVIDER: Lorrie Rothman, MD  REFERRING DIAGNOSIS:  R26.81 (ICD-10-CM) - Unsteadiness on feet  R53.1 (ICD-10-CM) - Weakness    THERAPY DIAG: Muscle weakness (generalized)  Difficulty in walking, not elsewhere classified  Imbalance  ONSET DATE: Over previous year, worsening  balance/stability  FOLLOW UP APPT WITH PROVIDER: Yes ; f/u on 04/11/24   RATIONALE FOR EVALUATION AND TREATMENT: Rehabilitation  Pertinent History Pt is a 73 year old female referred for unsteadiness and weakness by her PCP. Hx of multiple falls. Pt was also referred to hematology to check hemoglobin. Pt is following up with hematology this Thursday. Pt reports having a lot of falls - "every few weeks here lately." Pt reports fear related to falling. Pt reports falling in street and having to ask neighbors for help to get up; she tripped on loose gravel (this happened 2 weeks ago). Patient reports no hx of neuropathy; pt reports her feet feel "strange" or feel as if she is dragging while she is walking. Pt reports some loss of appetite and weight loss. Patient reports some atrophy in her legs. Pt denies night pain/disturbed sleep. Pt reports some urinary frequency at night time.    Pain: No Numbness/Tingling: No Focal Weakness: Yes; weakness in legs specifically Recent changes in overall health/medication: Yes; pt cutting back on Metformin  dosage per PCP, discontinued cholesterol medication Prior history of physical therapy for balance:  No Falls: Has patient fallen in last 6 months? Yes, Number of falls: 10+ Directional pattern for falls: Yes, loses pattern in home more going backward  Imaging: No  Prior level of function: Independent with basic ADLs Occupational demands: Retired  Hobbies: Draw (pt doesn't have as much interest)  Red flags (bowel/bladder changes,  saddle paresthesia, personal history of cancer, h/o spinal tumors, h/o compression fx, h/o abdominal aneurysm, abdominal pain, chills/fever, night sweats, nausea, vomiting, unrelenting pain): Negative  Precautions: Fall history  Weight Bearing Restrictions: No  Living Environment Lives with: lives alone; daughter comes (she lives 45 mins away) Lives in: House/apartment; 6 steps to get into home, handrails on both sides (can  reach both); grab bar for tub. Grass walkway up to steps.  Has following equipment at home: Otho Blitz - 2 wheeled, quad cane, other cane that pt does not feel safe with     Patient Goals: "Get some stability"    OBJECTIVE:   Patient Surveys  ABC: 60%   Gross Musculoskeletal Assessment Tremor: None Bulk: Atrophy in R>L quads Tone: Normal R hip hump, L scapula sits higher than R   GAIT: Distance walked: 80 ft Assistive device utilized: None Level of assistance: CGA Comments: Shortened step/stride length bilaterally, decreased heel strike at initial contact, maintains forward flexed posture throughout gait cycle, decreased terminal knee extension with terminal swing, decreased arm swing and truncal rigidity  Posture: Forward flexed posture in sitting and standing    LE MMT:  MMT (out of 5) Right 02/28/2024 Left 02/28/2024  Hip flexion 4- 4-  Hip extension    Hip abduction (seated) 4 4  Hip adduction (seated)  5 5  Hip internal rotation    Hip external rotation    Knee flexion 4+ 4  Knee extension 4+ 4+  Ankle dorsiflexion 4- 4-  Ankle plantarflexion    Ankle inversion    Ankle eversion    (* = pain; Blank rows = not tested)  Coordination/Cerebellar Finger to Nose: WNL Heel to Shin: WNL Rapid alternating movements: Impaired UEs, WNL LE Finger Opposition: WNL Pronator Drift: Negative   Postural Control Romberg EO/EC WNL Sharpened Romberg: 7 sec   FUNCTIONAL OUTCOME MEASURES   Results Comments  BERG 42/56   DGI 12/24   TUG 24.8 seconds Fall risk, in need of intervention  5TSTS Unable to complete sit to stand in standard position Fall risk, in need of intervention  (Blank rows = not tested)    TODAY'S TREATMENT   SUBJECTIVE STATEMENT:  No new complaints since last session.    Therapeutic Activity - improved strength as needed to improve performance of CKC activities/functional movements and as needed for power production to prevent fall during episode  of large postural perturbation  NuStep; Level 4, x 5 minutes - for improved soft tissue mobility and increased tissue temperature to improve muscle performance   -subjective gathered during this time  Dynamic march, in bars; x 2 D/B alternating R/L  Sit to stand with 4.4-lb Medball reach; 2 x 10  Step up/down onto airex pad x 10 leading with each LE - single UE support  Toe tapping; 6-inch step; 2 x 10 alternating  Hurdle stepping with 1x4 wooden boards; step-to pattern; 4x D/B with RLE and LLE leading  Standing hip abduction 2 x 10 each LE    PATIENT EDUCATION: Encouraged continued HEP and monitoring BP at home.    *not today* Sit to stand; reviewed for HEP Standing march, at counter; 2 x 10 alternating R/L Standing HR/TR; reviewed for HEP 3-way hip, in standing; 2 x 5 ea dir, bilat Hurdle stepping with 6-inch hurdles attempted; poor toe clearance with trail leg     PATIENT EDUCATION:  Education details: see above for patient education details Person educated: Patient Education method: Explanation and Demonstration Education comprehension: verbalized understanding  and returned demonstration   HOME EXERCISE PROGRAM: Access Code: [email or web address] URL: [email or web address] Date: 03/06/2024 Prepared by: Denese Finn  Exercises - Sit to Stand with Hands on Knees  - 2 x daily - 7 x weekly - 2 sets - 5-10 reps - Heel Toe Raises with Counter Support  - 2 x daily - 7 x weekly - 2 sets - 10 reps - Standing March with Counter Support  - 2 x daily - 7 x weekly - 2 sets - 10 reps - Standing 3-Way Kick  - 2 x daily - 7 x weekly - 2 sets - 5-6 reps   ASSESSMENT:  CLINICAL IMPRESSION:   Patient arrives to treatment session with complaints of feeling stiff and fatigued this date. Session focused on balance and strengthening but required frequent seated rest breaks. She does have ongoing forward flexed posture which may persist due to skeletal changes in spine. Pt needs  further work on balance, strengthening, and gait re-training. Patient will benefit from skilled PT to address above impairments and improve overall function.  REHAB POTENTIAL: Good  CLINICAL DECISION MAKING: Evolving/moderate complexity  EVALUATION COMPLEXITY: Moderate   GOALS: Goals reviewed with patient? No  SHORT TERM GOALS: Target date: 03/20/2024  Pt will be independent with HEP in order to improve strength and balance in order to decrease fall risk and improve function at home. Baseline: 02/28/24: Baseline HEP to be initiated on visit #2; discussed use of sit to stand (with UE assist prn) as exercise to reduce fall risk.  Goal status: INITIAL  Pt will perform independent sit to stand with no upper extremity support indicative of improved LE strength/power as needed to prevent fall and improved capacity for independent transferring Baseline: 02/28/24: Unable to complete sit to stand without UE assist.  Goal status: INITIAL  LONG TERM GOALS: Target date: 05/11/2024  Pt will negotiate 6 stairs with 1-2 handrail support safely with use of LRAD and no LOB as needed for entering/exiting home Baseline: 02/28/24: Has to use stairs for entering/exiting home, pt prone to tripping/falling.  Goal status: INITIAL  2.  Pt will improve BERG by at least 3 points in order to demonstrate clinically significant improvement in balance.   Baseline: 02/28/24: To be obtained on visit # 2.    03/01/24: 42/56 Goal status: INITIAL  3.  Pt will improve ABC by at least 13% in order to demonstrate clinically significant improvement in balance confidence.      Baseline: 02/28/24: 60% Goal status: INITIAL  5. Pt will improve DGI by at least 3 points in order to demonstrate clinically significant improvement in balance and decreased risk for falls.     Baseline: 02/28/24: To be obtained on visit # 2.   03/01/24: 12/24 Goal status: INITIAL  6. Pt will decrease TUG to below 14 seconds/decrease in order to demonstrate  decreased fall risk.  Baseline: 02/28/24: 24.8 sec  Goal status: INITIAL    PLAN: PT FREQUENCY: 2x/week  PT DURATION: 12 weeks  PLANNED INTERVENTIONS: Therapeutic exercises, Therapeutic activity, Neuromuscular re-education, Balance training, Gait training, Patient/Family education, Joint manipulation, Joint mobilization, Canalith repositioning, Aquatic Therapy, Dry Needling, Cognitive remediation, Electrical stimulation, Spinal manipulation, Spinal mobilization, Cryotherapy, Moist heat, Traction, Ultrasound, Ionotophoresis 4mg /ml Dexamethasone , and Manual therapy  PLAN FOR NEXT SESSION:  Continue with LE strengthening and balance training, weight shifting/toe tapping. Movement control training for step length/heel strike. Update HEP with successive PT visits.   Janine Melbourne, PT, DPT Physical Therapist - [place]  [facility]  Miciah Covelli A Ilena Dieckman 03/15/2024, 2:08 PM

## 2024-03-15 ENCOUNTER — Ambulatory Visit

## 2024-03-15 ENCOUNTER — Encounter: Payer: Self-pay | Admitting: Physical Therapy

## 2024-03-15 DIAGNOSIS — R2689 Other abnormalities of gait and mobility: Secondary | ICD-10-CM

## 2024-03-15 DIAGNOSIS — R262 Difficulty in walking, not elsewhere classified: Secondary | ICD-10-CM

## 2024-03-15 DIAGNOSIS — M6281 Muscle weakness (generalized): Secondary | ICD-10-CM

## 2024-03-21 ENCOUNTER — Encounter: Payer: Self-pay | Admitting: Physical Therapy

## 2024-03-21 ENCOUNTER — Ambulatory Visit: Admitting: Physical Therapy

## 2024-03-21 DIAGNOSIS — M6281 Muscle weakness (generalized): Secondary | ICD-10-CM

## 2024-03-21 DIAGNOSIS — R2689 Other abnormalities of gait and mobility: Secondary | ICD-10-CM

## 2024-03-21 DIAGNOSIS — R262 Difficulty in walking, not elsewhere classified: Secondary | ICD-10-CM

## 2024-03-21 NOTE — Therapy (Signed)
 OUTPATIENT PHYSICAL THERAPY TREATMENT   Patient Name: Angela Ballard MRN: [id number] DOB:Apr 20, 1951, 73 y.o., female Today's Date: 03/21/2024   PT End of Session - 03/21/24 1037     Visit Number 7    Number of Visits 21    Date for PT Re-Evaluation 05/08/24    Authorization Type [employer] 2025, VL based on auth    PT Start Time 1033    PT Stop Time 1118    PT Time Calculation (min) 45 min    Equipment Utilized During Treatment Gait belt    Activity Tolerance Patient tolerated treatment well    Behavior During Therapy WFL for tasks assessed/performed                 Past Medical History:  Diagnosis Date   Diabetes mellitus without complication (HCC)    Family history of adverse reaction to anesthesia    GRANDFATHER-PASSED AWAY DURING SURGERY-UNSURE   Headache    H/O   Hyperlipidemia    Hypertension    Past Surgical History:  Procedure Laterality Date   APPENDECTOMY     PAROTIDECTOMY Left 10/05/2016   Procedure: PAROTIDECTOMY;  Surgeon: Mellody Sprout, MD;  Location: [facility] ORS;  Service: ENT;  Laterality: Left;   TONSILECTOMY, ADENOIDECTOMY, BILATERAL MYRINGOTOMY AND TUBES  child   TONSILLECTOMY     WISDOM TOOTH EXTRACTION  teenager   Patient Active Problem List   Diagnosis Date Noted   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   Neck mass 08/27/2016   Controlled type 2 diabetes mellitus without complication, without long-term current use of insulin  (HCC) 06/21/2015   Episodic low back pain 06/15/2014   Benign essential hypertension 03/09/2014   Pure hypercholesterolemia 03/09/2014   Type 2 diabetes mellitus, controlled (HCC) 03/09/2014    PCP: Lorrie Rothman, MD  REFERRING PROVIDER: Lorrie Rothman, MD  REFERRING DIAGNOSIS:  R26.81 (ICD-10-CM) - Unsteadiness on feet  R53.1 (ICD-10-CM) - Weakness    THERAPY DIAG: Muscle weakness (generalized)  Difficulty in walking, not elsewhere classified  Imbalance  ONSET DATE: Over previous year, worsening  balance/stability  FOLLOW UP APPT WITH PROVIDER: Yes ; f/u on 04/11/24   RATIONALE FOR EVALUATION AND TREATMENT: Rehabilitation  Pertinent History Pt is a 73 year old female referred for unsteadiness and weakness by her PCP. Hx of multiple falls. Pt was also referred to hematology to check hemoglobin. Pt is following up with hematology this Thursday. Pt reports having a lot of falls - "every few weeks here lately." Pt reports fear related to falling. Pt reports falling in street and having to ask neighbors for help to get up; she tripped on loose gravel (this happened 2 weeks ago). Patient reports no hx of neuropathy; pt reports her feet feel "strange" or feel as if she is dragging while she is walking. Pt reports some loss of appetite and weight loss. Patient reports some atrophy in her legs. Pt denies night pain/disturbed sleep. Pt reports some urinary frequency at night time.    Pain: No Numbness/Tingling: No Focal Weakness: Yes; weakness in legs specifically Recent changes in overall health/medication: Yes; pt cutting back on Metformin  dosage per PCP, discontinued cholesterol medication Prior history of physical therapy for balance:  No Falls: Has patient fallen in last 6 months? Yes, Number of falls: 10+ Directional pattern for falls: Yes, loses pattern in home more going backward  Imaging: No  Prior level of function: Independent with basic ADLs Occupational demands: Retired  Hobbies: Draw (pt doesn't have as much interest)  Red flags (bowel/bladder  changes, saddle paresthesia, personal history of cancer, h/o spinal tumors, h/o compression fx, h/o abdominal aneurysm, abdominal pain, chills/fever, night sweats, nausea, vomiting, unrelenting pain): Negative  Precautions: Fall history  Weight Bearing Restrictions: No  Living Environment Lives with: lives alone; daughter comes (she lives 45 mins away) Lives in: House/apartment; 6 steps to get into home, handrails on both sides (can  reach both); grab bar for tub. Grass walkway up to steps.  Has following equipment at home: Otho Blitz - 2 wheeled, quad cane, other cane that pt does not feel safe with     Patient Goals: "Get some stability"    OBJECTIVE:   Patient Surveys  ABC: 60%   Gross Musculoskeletal Assessment Tremor: None Bulk: Atrophy in R>L quads Tone: Normal R hip hump, L scapula sits higher than R   GAIT: Distance walked: 80 ft Assistive device utilized: None Level of assistance: CGA Comments: Shortened step/stride length bilaterally, decreased heel strike at initial contact, maintains forward flexed posture throughout gait cycle, decreased terminal knee extension with terminal swing, decreased arm swing and truncal rigidity  Posture: Forward flexed posture in sitting and standing    LE MMT:  MMT (out of 5) Right 02/28/2024 Left 02/28/2024  Hip flexion 4- 4-  Hip extension    Hip abduction (seated) 4 4  Hip adduction (seated)  5 5  Hip internal rotation    Hip external rotation    Knee flexion 4+ 4  Knee extension 4+ 4+  Ankle dorsiflexion 4- 4-  Ankle plantarflexion    Ankle inversion    Ankle eversion    (* = pain; Blank rows = not tested)  Coordination/Cerebellar Finger to Nose: WNL Heel to Shin: WNL Rapid alternating movements: Impaired UEs, WNL LE Finger Opposition: WNL Pronator Drift: Negative   Postural Control Romberg EO/EC WNL Sharpened Romberg: 7 sec   FUNCTIONAL OUTCOME MEASURES   Results Comments  BERG 42/56   DGI 12/24   TUG 24.8 seconds Fall risk, in need of intervention  5TSTS Unable to complete sit to stand in standard position Fall risk, in need of intervention  (Blank rows = not tested)    TODAY'S TREATMENT   SUBJECTIVE STATEMENT:  Patient reports SBP in 140s over last couple of days. She had one episode of SBP in 170s; she reports frequent increases/decreases in BP. Patient reports seeing SBP in 160s frequently. Patient reports doing relatively well  with safety in her home and with functional mobility tasks.    Therapeutic Activity - improved strength as needed to improve performance of CKC activities/functional movements and as needed for power production to prevent fall during episode of large postural perturbation  NuStep; Level 4, x 5 minutes - for improved soft tissue mobility and increased tissue temperature to improve muscle performance   -subjective gathered during this time  Dynamic march, in bars; x 3 D/B alternating R/L  Target stepping on blue agility ladder, for inc step length bilat; x4 D/B  Hurdle stepping with (3) 1x4 wooden boards and 1 long Airex (horizontal); step-to pattern; 4x D/B with RLE and LLE leading  Sit to stand with pt standing on Airex; Airex on seat to inc seat height also; 2 x 10  Step up/down onto airex pad x 15 leading with each LE - single UE support  Toe tapping; 6-inch step; 1 x 10 alternating  Toe tapping; 6-inch step and pt standing on Airex; 1 x 10 alternating   PATIENT EDUCATION: Encouraged continued HEP and monitoring BP at home.  Discussed following up with PCP regarding medical management for BP   *not today* Standing hip abduction 2 x 10 each LE  Sit to stand; reviewed for HEP Standing march, at counter; 2 x 10 alternating R/L Standing HR/TR; reviewed for HEP 3-way hip, in standing; 2 x 5 ea dir, bilat Hurdle stepping with 6-inch hurdles attempted; poor toe clearance with trail leg     PATIENT EDUCATION:  Education details: see above for patient education details Person educated: Patient Education method: Explanation and Demonstration Education comprehension: verbalized understanding and returned demonstration   HOME EXERCISE PROGRAM: Access Code: [email or web address] URL: [email or web address] Date: 03/06/2024 Prepared by: Denese Finn  Exercises - Sit to Stand with Hands on Knees  - 2 x daily - 7 x weekly - 2 sets - 5-10 reps - Heel Toe Raises with Counter  Support  - 2 x daily - 7 x weekly - 2 sets - 10 reps - Standing March with Counter Support  - 2 x daily - 7 x weekly - 2 sets - 10 reps - Standing 3-Way Kick  - 2 x daily - 7 x weekly - 2 sets - 5-6 reps   ASSESSMENT:  CLINICAL IMPRESSION:   Patient reports good response with PT to date with improving gait and balance. She does have intermittent hypertension in stage I-II ranges; pt has not had recent numbers in hypertensive urgency range. I discussed with pt following up with her MD regarding medical management for BP. Pt exhibits notably improving gait quality and heel strike at initial contact. Pt needs further work on balance, strengthening, and gait re-training. Patient will benefit from skilled PT to address above impairments and improve overall function.  REHAB POTENTIAL: Good  CLINICAL DECISION MAKING: Evolving/moderate complexity  EVALUATION COMPLEXITY: Moderate   GOALS: Goals reviewed with patient? No  SHORT TERM GOALS: Target date: 03/20/2024  Pt will be independent with HEP in order to improve strength and balance in order to decrease fall risk and improve function at home. Baseline: 02/28/24: Baseline HEP to be initiated on visit #2; discussed use of sit to stand (with UE assist prn) as exercise to reduce fall risk.  Goal status: INITIAL  Pt will perform independent sit to stand with no upper extremity support indicative of improved LE strength/power as needed to prevent fall and improved capacity for independent transferring Baseline: 02/28/24: Unable to complete sit to stand without UE assist.  Goal status: INITIAL  LONG TERM GOALS: Target date: 05/11/2024  Pt will negotiate 6 stairs with 1-2 handrail support safely with use of LRAD and no LOB as needed for entering/exiting home Baseline: 02/28/24: Has to use stairs for entering/exiting home, pt prone to tripping/falling.  Goal status: INITIAL  2.  Pt will improve BERG by at least 3 points in order to demonstrate clinically  significant improvement in balance.   Baseline: 02/28/24: To be obtained on visit # 2.    03/01/24: 42/56 Goal status: INITIAL  3.  Pt will improve ABC by at least 13% in order to demonstrate clinically significant improvement in balance confidence.      Baseline: 02/28/24: 60% Goal status: INITIAL  5. Pt will improve DGI by at least 3 points in order to demonstrate clinically significant improvement in balance and decreased risk for falls.     Baseline: 02/28/24: To be obtained on visit # 2.   03/01/24: 12/24 Goal status: INITIAL  6. Pt will decrease TUG to below 14 seconds/decrease in order to demonstrate decreased fall risk.  Baseline: 02/28/24: 24.8 sec  Goal status: INITIAL    PLAN: PT FREQUENCY: 2x/week  PT DURATION: 12 weeks  PLANNED INTERVENTIONS: Therapeutic exercises, Therapeutic activity, Neuromuscular re-education, Balance training, Gait training, Patient/Family education, Joint manipulation, Joint mobilization, Canalith repositioning, Aquatic Therapy, Dry Needling, Cognitive remediation, Electrical stimulation, Spinal manipulation, Spinal mobilization, Cryotherapy, Moist heat, Traction, Ultrasound, Ionotophoresis 4mg /ml Dexamethasone , and Manual therapy  PLAN FOR NEXT SESSION:  Continue with LE strengthening and balance training, weight shifting/toe tapping. Movement control training for step length/heel strike. Update HEP with successive PT visits.    Denese Finn, PT, DPT [phone] Physical Therapist - [facility]   Aleatha Hunting 03/21/2024, 1:23 PM

## 2024-03-22 ENCOUNTER — Inpatient Hospital Stay (HOSPITAL_BASED_OUTPATIENT_CLINIC_OR_DEPARTMENT_OTHER): Admitting: Oncology

## 2024-03-22 ENCOUNTER — Encounter: Payer: Self-pay | Admitting: Oncology

## 2024-03-22 VITALS — BP 178/85 | HR 88 | Temp 97.4°F | Resp 16 | Ht 67.0 in | Wt 144.7 lb

## 2024-03-22 DIAGNOSIS — Z803 Family history of malignant neoplasm of breast: Secondary | ICD-10-CM | POA: Diagnosis not present

## 2024-03-22 DIAGNOSIS — E785 Hyperlipidemia, unspecified: Secondary | ICD-10-CM | POA: Diagnosis not present

## 2024-03-22 DIAGNOSIS — I10 Essential (primary) hypertension: Secondary | ICD-10-CM | POA: Diagnosis not present

## 2024-03-22 DIAGNOSIS — F17210 Nicotine dependence, cigarettes, uncomplicated: Secondary | ICD-10-CM | POA: Diagnosis not present

## 2024-03-22 DIAGNOSIS — D751 Secondary polycythemia: Secondary | ICD-10-CM

## 2024-03-22 DIAGNOSIS — E119 Type 2 diabetes mellitus without complications: Secondary | ICD-10-CM | POA: Diagnosis not present

## 2024-03-22 NOTE — Progress Notes (Signed)
 [facility]  [phone] Fax:(336) [phone]  ID: AHNI BRADWELL OB: 01/17/1951  MR#: [id number]  AOZ#:[id number]  Patient Care Team: Lorrie Rothman, MD as PCP - General (Family Medicine) Shellie Dials, MD as Consulting Physician (Hematology and Oncology)  CHIEF COMPLAINT: Polycythemia.  INTERVAL HISTORY: Patient returns to clinic today for further evaluation and discussion of her laboratory results.  She continues to feel well and remains asymptomatic. She has no neurologic complaints.  She denies any recent fevers or illnesses.  She has a good appetite and denies weight loss.  She has no chest pain, shortness of breath, cough, or hemoptysis.  She denies any nausea, vomiting, constipation, or diarrhea.  She has no urinary complaints.  Patient offers no specific complaints today.  REVIEW OF SYSTEMS:   Review of Systems  Constitutional: Negative.  Negative for fever, malaise/fatigue and weight loss.  Respiratory: Negative.  Negative for cough, hemoptysis and shortness of breath.   Cardiovascular: Negative.  Negative for chest pain and leg swelling.  Gastrointestinal: Negative.  Negative for abdominal pain.  Genitourinary: Negative.  Negative for dysuria.  Musculoskeletal: Negative.  Negative for back pain.  Skin: Negative.  Negative for rash.  Neurological: Negative.  Negative for dizziness, focal weakness, weakness and headaches.  Psychiatric/Behavioral: Negative.  The patient is not nervous/anxious.     As per HPI. Otherwise, a complete review of systems is negative.  PAST MEDICAL HISTORY: Past Medical History:  Diagnosis Date   Diabetes mellitus without complication (HCC)    Family history of adverse reaction to anesthesia    GRANDFATHER-PASSED AWAY DURING SURGERY-UNSURE   Headache    H/O   Hyperlipidemia    Hypertension     PAST SURGICAL HISTORY: Past Surgical History:  Procedure Laterality Date   APPENDECTOMY     PAROTIDECTOMY  Left 10/05/2016   Procedure: PAROTIDECTOMY;  Surgeon: Mellody Sprout, MD;  Location: [facility] ORS;  Service: ENT;  Laterality: Left;   TONSILECTOMY, ADENOIDECTOMY, BILATERAL MYRINGOTOMY AND TUBES  child   TONSILLECTOMY     WISDOM TOOTH EXTRACTION  teenager    FAMILY HISTORY: Family History  Problem Relation Age of Onset   Breast cancer Mother    Peripheral Artery Disease Sister    Epilepsy Brother     ADVANCED DIRECTIVES (Y/N):  N  HEALTH MAINTENANCE: Social History   Tobacco Use   Smoking status: Every Day    Current packs/day: 1.00    Average packs/day: 1 pack/day for 40.0 years (40.0 ttl pk-yrs)    Types: Cigarettes   Smokeless tobacco: Never  Substance Use Topics   Alcohol use: Yes    Comment: 2 beer daily   Drug use: No     Colonoscopy:  PAP:  Bone density:  Lipid panel:  Allergies  Allergen Reactions   Advil [Ibuprofen] Hives   Pravastatin Other (See Comments)    Leg cramps    Penicillins Rash    Has patient had a PCN reaction causing immediate rash, facial/tongue/throat swelling, SOB or lightheadedness with hypotension: No Has patient had a PCN reaction causing severe rash involving mucus membranes or skin necrosis: No Has patient had a PCN reaction that required hospitalization No Has patient had a PCN reaction occurring within the last 10 years: No If all of the above answers are "NO", then may proceed with Cephalosporin use.     Current Outpatient Medications  Medication Sig Dispense Refill   Biotin 96295 MCG TABS Take 10,000 mcg by mouth daily.  CINNAMON PO Take 1,000 mg by mouth 2 (two) times daily.     Garlic 1000 MG CAPS Take 1,000 mg by mouth 2 (two) times daily.     ibuprofen (ADVIL,MOTRIN) 200 MG tablet Take 200-400 mg by mouth every 6 (six) hours as needed for headache or moderate pain.     lisinopril -hydrochlorothiazide  (PRINZIDE ,ZESTORETIC ) 10-12.5 MG tablet Take 1 tablet by mouth daily.      Magnesium  500 MG TABS Take 500 mg by mouth daily  as needed (cramps).     metFORMIN  (GLUCOPHAGE ) 500 MG tablet Take 500 mg by mouth 2 (two) times daily.      Multiple Vitamin (MULTI-VITAMINS) TABS Take 1 tablet by mouth daily.      Omega-3 Fatty Acids (FISH OIL) 1200 MG CAPS Take 1,200 mg by mouth 2 (two) times daily.     OVER THE COUNTER MEDICATION Take 400 mg by mouth daily. Berberine 400mg  tab     Red Yeast Rice 600 MG TABS Take 600 mg by mouth daily.     TURMERIC PO Take 800 mg by mouth daily.     Zinc  50 MG TABS Take 50 mg by mouth daily.     Cholecalciferol 50 MCG (2000 UT) CAPS Take 2,000 Units by mouth daily.     No current facility-administered medications for this visit.    OBJECTIVE: Vitals:   03/22/24 1013  BP: (!) 178/85  Pulse: 88  Resp: 16  Temp: (!) 97.4 F (36.3 C)  SpO2: 98%     Body mass index is 22.66 kg/m.    ECOG FS:0 - Asymptomatic  General: Well-developed, well-nourished, no acute distress.  Sitting in a wheelchair. Eyes: Pink conjunctiva, anicteric sclera. HEENT: Normocephalic, moist mucous membranes. Lungs: No audible wheezing or coughing. Heart: Regular rate and rhythm. Abdomen: Soft, nontender, no obvious distention. Musculoskeletal: No edema, cyanosis, or clubbing. Neuro: Alert, answering all questions appropriately. Cranial nerves grossly intact. Skin: No rashes or petechiae noted. Psych: Normal affect.  LAB RESULTS:  Lab Results  Component Value Date   NA 129 (L) 08/27/2016   K 4.1 08/27/2016   CL 95 (L) 08/27/2016   CO2 24 08/27/2016   GLUCOSE 147 (H) 08/27/2016   BUN 10 08/27/2016   CREATININE 0.67 08/27/2016   CALCIUM 9.6 08/27/2016   GFRNONAA >60 08/27/2016   GFRAA >60 08/27/2016    Lab Results  Component Value Date   WBC 10.0 03/02/2024   HGB 17.6 (H) 03/02/2024   HCT 53.3 (H) 03/02/2024   MCV 93.0 03/02/2024   PLT 335 03/02/2024   Lab Results  Component Value Date   IRON 65 03/02/2024   TIBC 504 (H) 03/02/2024   IRONPCTSAT 13 03/02/2024   Lab Results  Component  Value Date   FERRITIN 12 03/02/2024     STUDIES: No results found.  ASSESSMENT: Polycythemia.  PLAN:    Polycythemia: Possibly secondary to tobacco use given her elevated carbon monoxide levels.  Her most recent hemoglobin was reported at 17.6.  All of her laboratory work including iron stores and Jak mutation with reflex are either negative or within normal limits.  No intervention is needed at this time.  Patient does not require phlebotomy.  Return to clinic in 3 months with repeat laboratory work and further evaluation.   Hypertension: Chronic and unchanged.  Patient's blood pressure remains significantly elevated.  Continue monitoring and treatment per primary care. Smoking cessation: Patient reports that she is attempting to quit tobacco use.   Patient expressed understanding and  was in agreement with this plan. She also understands that She can call clinic at any time with any questions, concerns, or complaints.    Shellie Dials, MD   03/22/2024 10:41 AM

## 2024-03-23 ENCOUNTER — Ambulatory Visit: Admitting: Physical Therapy

## 2024-03-23 DIAGNOSIS — R262 Difficulty in walking, not elsewhere classified: Secondary | ICD-10-CM

## 2024-03-23 DIAGNOSIS — M6281 Muscle weakness (generalized): Secondary | ICD-10-CM | POA: Diagnosis not present

## 2024-03-23 DIAGNOSIS — R2689 Other abnormalities of gait and mobility: Secondary | ICD-10-CM

## 2024-03-23 NOTE — Therapy (Unsigned)
 OUTPATIENT PHYSICAL THERAPY TREATMENT   Patient Name: Angela Ballard MRN: [id number] DOB:10-Aug-1951, 73 y.o., female Today's Date: 03/23/2024   PT End of Session - 03/23/24 1030     Visit Number 8    Number of Visits 21    Date for PT Re-Evaluation 05/08/24    Authorization Type [employer] 2025, VL based on auth    PT Start Time 1030    PT Stop Time 1113    PT Time Calculation (min) 43 min    Equipment Utilized During Treatment Gait belt    Activity Tolerance Patient tolerated treatment well    Behavior During Therapy WFL for tasks assessed/performed                  Past Medical History:  Diagnosis Date   Diabetes mellitus without complication (HCC)    Family history of adverse reaction to anesthesia    GRANDFATHER-PASSED AWAY DURING SURGERY-UNSURE   Headache    H/O   Hyperlipidemia    Hypertension    Past Surgical History:  Procedure Laterality Date   APPENDECTOMY     PAROTIDECTOMY Left 10/05/2016   Procedure: PAROTIDECTOMY;  Surgeon: Mellody Sprout, MD;  Location: [facility] ORS;  Service: ENT;  Laterality: Left;   TONSILECTOMY, ADENOIDECTOMY, BILATERAL MYRINGOTOMY AND TUBES  child   TONSILLECTOMY     WISDOM TOOTH EXTRACTION  teenager   Patient Active Problem List   Diagnosis Date Noted   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   Neck mass 08/27/2016   Controlled type 2 diabetes mellitus without complication, without long-term current use of insulin  (HCC) 06/21/2015   Episodic low back pain 06/15/2014   Benign essential hypertension 03/09/2014   Pure hypercholesterolemia 03/09/2014   Type 2 diabetes mellitus, controlled (HCC) 03/09/2014    PCP: Lorrie Rothman, MD  REFERRING PROVIDER: Lorrie Rothman, MD  REFERRING DIAGNOSIS:  R26.81 (ICD-10-CM) - Unsteadiness on feet  R53.1 (ICD-10-CM) - Weakness    THERAPY DIAG: Muscle weakness (generalized)  Difficulty in walking, not elsewhere classified  Imbalance  ONSET DATE: Over previous year, worsening  balance/stability  FOLLOW UP APPT WITH PROVIDER: Yes ; f/u on 04/11/24   RATIONALE FOR EVALUATION AND TREATMENT: Rehabilitation  Pertinent History Pt is a 73 year old female referred for unsteadiness and weakness by her PCP. Hx of multiple falls. Pt was also referred to hematology to check hemoglobin. Pt is following up with hematology this Thursday. Pt reports having a lot of falls - "every few weeks here lately." Pt reports fear related to falling. Pt reports falling in street and having to ask neighbors for help to get up; she tripped on loose gravel (this happened 2 weeks ago). Patient reports no hx of neuropathy; pt reports her feet feel "strange" or feel as if she is dragging while she is walking. Pt reports some loss of appetite and weight loss. Patient reports some atrophy in her legs. Pt denies night pain/disturbed sleep. Pt reports some urinary frequency at night time.    Pain: No Numbness/Tingling: No Focal Weakness: Yes; weakness in legs specifically Recent changes in overall health/medication: Yes; pt cutting back on Metformin  dosage per PCP, discontinued cholesterol medication Prior history of physical therapy for balance:  No Falls: Has patient fallen in last 6 months? Yes, Number of falls: 10+ Directional pattern for falls: Yes, loses pattern in home more going backward  Imaging: No  Prior level of function: Independent with basic ADLs Occupational demands: Retired  Hobbies: Draw (pt doesn't have as much interest)  Red flags (  bowel/bladder changes, saddle paresthesia, personal history of cancer, h/o spinal tumors, h/o compression fx, h/o abdominal aneurysm, abdominal pain, chills/fever, night sweats, nausea, vomiting, unrelenting pain): Negative  Precautions: Fall history  Weight Bearing Restrictions: No  Living Environment Lives with: lives alone; daughter comes (she lives 45 mins away) Lives in: House/apartment; 6 steps to get into home, handrails on both sides (can  reach both); grab bar for tub. Grass walkway up to steps.  Has following equipment at home: Otho Blitz - 2 wheeled, quad cane, other cane that pt does not feel safe with     Patient Goals: "Get some stability"    OBJECTIVE:   Patient Surveys  ABC: 60%   Gross Musculoskeletal Assessment Tremor: None Bulk: Atrophy in R>L quads Tone: Normal R hip hump, L scapula sits higher than R   GAIT: Distance walked: 80 ft Assistive device utilized: None Level of assistance: CGA Comments: Shortened step/stride length bilaterally, decreased heel strike at initial contact, maintains forward flexed posture throughout gait cycle, decreased terminal knee extension with terminal swing, decreased arm swing and truncal rigidity  Posture: Forward flexed posture in sitting and standing    LE MMT:  MMT (out of 5) Right 02/28/2024 Left 02/28/2024  Hip flexion 4- 4-  Hip extension    Hip abduction (seated) 4 4  Hip adduction (seated)  5 5  Hip internal rotation    Hip external rotation    Knee flexion 4+ 4  Knee extension 4+ 4+  Ankle dorsiflexion 4- 4-  Ankle plantarflexion    Ankle inversion    Ankle eversion    (* = pain; Blank rows = not tested)  Coordination/Cerebellar Finger to Nose: WNL Heel to Shin: WNL Rapid alternating movements: Impaired UEs, WNL LE Finger Opposition: WNL Pronator Drift: Negative   Postural Control Romberg EO/EC WNL Sharpened Romberg: 7 sec   FUNCTIONAL OUTCOME MEASURES   Results Comments  BERG 42/56   DGI 12/24   TUG 24.8 seconds Fall risk, in need of intervention  5TSTS Unable to complete sit to stand in standard position Fall risk, in need of intervention  (Blank rows = not tested)    TODAY'S TREATMENT   SUBJECTIVE STATEMENT:  Patient reports no blood pressure in urgency range, but she has frequently seen systolic BP in 150-160 range. Pt followed up with hematology yesterday, and they instructed pt on following up with PCP for BP management. Pt  reports doing better with transfers and functional mobility since starting PT.    Therapeutic Activity - improved strength as needed to improve performance of CKC activities/functional movements and as needed for power production to prevent fall during episode of large postural perturbation  NuStep; Level 5, x 5 minutes - for improved soft tissue mobility and increased tissue temperature to improve muscle performance   -subjective gathered during this time  Ambulate laps around gym with verbal cueing for heel strike at initial contact and longer stride lengths; x 3 laps Target stepping on blue agility ladder, for inc step length bilat; x4 D/B  Standing march; light touch on treadmill armrest; 5-lb ankle weights; 2 x 10, alt R/L   Step up/down onto airex pad x 15 leading with each LE - single UE support  Toe tapping; 6-inch step with Airex on top for inc height; 2  x 10, alt   Sit to stand with pt standing on Airex; Airex on seat to inc seat height also; 2 x 10   PATIENT EDUCATION: Encouraged continued HEP and monitoring  BP at home. Discussed following up with PCP regarding medical management for BP  *next visit* Hurdle stepping with (3) 1x4 wooden boards and 1 long Airex (horizontal); step-to pattern; 4x D/B with RLE and LLE leading  *not today* Standing hip abduction 2 x 10 each LE  Sit to stand; reviewed for HEP Standing march, at counter; 2 x 10 alternating R/L Standing HR/TR; reviewed for HEP 3-way hip, in standing; 2 x 5 ea dir, bilat Hurdle stepping with 6-inch hurdles attempted; poor toe clearance with trail leg     PATIENT EDUCATION:  Education details: see above for patient education details Person educated: Patient Education method: Explanation and Demonstration Education comprehension: verbalized understanding and returned demonstration   HOME EXERCISE PROGRAM: Access Code: [email or web address] URL: [email or web address] Date: 03/06/2024 Prepared by:  Denese Finn  Exercises - Sit to Stand with Hands on Knees  - 2 x daily - 7 x weekly - 2 sets - 5-10 reps - Heel Toe Raises with Counter Support  - 2 x daily - 7 x weekly - 2 sets - 10 reps - Standing March with Counter Support  - 2 x daily - 7 x weekly - 2 sets - 10 reps - Standing 3-Way Kick  - 2 x daily - 7 x weekly - 2 sets - 5-6 reps   ASSESSMENT:  CLINICAL IMPRESSION:   Patient feels that PT is helping, and she demonstrates markedly improved gait pattern. She does have structural skeletal changes that lead to flexed posture and pt largely remaining in combined hip/knee flexion during standing and walking activity. In spite of these changes, pt is able to work on more functional step length and heel striking at initial contact successfully. We continued with LE strengthening and progression of balance training. Pt needs further work on balance, strengthening, and gait re-training. Patient will benefit from skilled PT to address above impairments and improve overall function.  REHAB POTENTIAL: Good  CLINICAL DECISION MAKING: Evolving/moderate complexity  EVALUATION COMPLEXITY: Moderate   GOALS: Goals reviewed with patient? No  SHORT TERM GOALS: Target date: 03/20/2024  Pt will be independent with HEP in order to improve strength and balance in order to decrease fall risk and improve function at home. Baseline: 02/28/24: Baseline HEP to be initiated on visit #2; discussed use of sit to stand (with UE assist prn) as exercise to reduce fall risk.  Goal status: INITIAL  Pt will perform independent sit to stand with no upper extremity support indicative of improved LE strength/power as needed to prevent fall and improved capacity for independent transferring Baseline: 02/28/24: Unable to complete sit to stand without UE assist.  Goal status: INITIAL  LONG TERM GOALS: Target date: 05/11/2024  Pt will negotiate 6 stairs with 1-2 handrail support safely with use of LRAD and no LOB as  needed for entering/exiting home Baseline: 02/28/24: Has to use stairs for entering/exiting home, pt prone to tripping/falling.  Goal status: INITIAL  2.  Pt will improve BERG by at least 3 points in order to demonstrate clinically significant improvement in balance.   Baseline: 02/28/24: To be obtained on visit # 2.    03/01/24: 42/56 Goal status: INITIAL  3.  Pt will improve ABC by at least 13% in order to demonstrate clinically significant improvement in balance confidence.      Baseline: 02/28/24: 60% Goal status: INITIAL  5. Pt will improve DGI by at least 3 points in order to demonstrate clinically significant improvement in balance and decreased risk for falls.  Baseline: 02/28/24: To be obtained on visit # 2.   03/01/24: 12/24 Goal status: INITIAL  6. Pt will decrease TUG to below 14 seconds/decrease in order to demonstrate decreased fall risk.  Baseline: 02/28/24: 24.8 sec  Goal status: INITIAL    PLAN: PT FREQUENCY: 2x/week  PT DURATION: 12 weeks  PLANNED INTERVENTIONS: Therapeutic exercises, Therapeutic activity, Neuromuscular re-education, Balance training, Gait training, Patient/Family education, Joint manipulation, Joint mobilization, Canalith repositioning, Aquatic Therapy, Dry Needling, Cognitive remediation, Electrical stimulation, Spinal manipulation, Spinal mobilization, Cryotherapy, Moist heat, Traction, Ultrasound, Ionotophoresis 4mg /ml Dexamethasone , and Manual therapy  PLAN FOR NEXT SESSION:  Continue with LE strengthening and balance training, weight shifting/toe tapping. Movement control training for step length/heel strike. Update HEP with successive PT visits.    Denese Finn, PT, DPT [phone] Physical Therapist - [facility]   Aleatha Hunting 03/23/2024, 10:30 AM

## 2024-03-24 ENCOUNTER — Encounter: Payer: Self-pay | Admitting: Physical Therapy

## 2024-03-27 ENCOUNTER — Ambulatory Visit: Attending: Family Medicine | Admitting: Physical Therapy

## 2024-03-27 ENCOUNTER — Encounter: Payer: Self-pay | Admitting: Physical Therapy

## 2024-03-27 VITALS — BP 164/77 | HR 84

## 2024-03-27 DIAGNOSIS — R2689 Other abnormalities of gait and mobility: Secondary | ICD-10-CM | POA: Diagnosis not present

## 2024-03-27 DIAGNOSIS — R262 Difficulty in walking, not elsewhere classified: Secondary | ICD-10-CM | POA: Diagnosis not present

## 2024-03-27 DIAGNOSIS — M6281 Muscle weakness (generalized): Secondary | ICD-10-CM | POA: Insufficient documentation

## 2024-03-27 NOTE — Therapy (Signed)
 OUTPATIENT PHYSICAL THERAPY TREATMENT   Patient Name: Angela Ballard MRN: [id number] DOB:Oct 15, 1951, 73 y.o., female Today's Date: 03/27/2024   PT End of Session - 03/27/24 1055     Visit Number 9    Number of Visits 21    Date for PT Re-Evaluation 05/08/24    Authorization Type [employer] 2025, VL based on auth    PT Start Time 1115    PT Stop Time 1201    PT Time Calculation (min) 46 min    Equipment Utilized During Treatment Gait belt    Activity Tolerance Patient tolerated treatment well    Behavior During Therapy WFL for tasks assessed/performed             Past Medical History:  Diagnosis Date   Diabetes mellitus without complication (HCC)    Family history of adverse reaction to anesthesia    GRANDFATHER-PASSED AWAY DURING SURGERY-UNSURE   Headache    H/O   Hyperlipidemia    Hypertension    Past Surgical History:  Procedure Laterality Date   APPENDECTOMY     PAROTIDECTOMY Left 10/05/2016   Procedure: PAROTIDECTOMY;  Surgeon: Mellody Sprout, MD;  Location: [facility] ORS;  Service: ENT;  Laterality: Left;   TONSILECTOMY, ADENOIDECTOMY, BILATERAL MYRINGOTOMY AND TUBES  child   TONSILLECTOMY     WISDOM TOOTH EXTRACTION  teenager   Patient Active Problem List   Diagnosis Date Noted   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   Neck mass 08/27/2016   Controlled type 2 diabetes mellitus without complication, without long-term current use of insulin  (HCC) 06/21/2015   Episodic low back pain 06/15/2014   Benign essential hypertension 03/09/2014   Pure hypercholesterolemia 03/09/2014   Type 2 diabetes mellitus, controlled (HCC) 03/09/2014    PCP: Lorrie Rothman, MD  REFERRING PROVIDER: Lorrie Rothman, MD  REFERRING DIAGNOSIS:  R26.81 (ICD-10-CM) - Unsteadiness on feet  R53.1 (ICD-10-CM) - Weakness    THERAPY DIAG: Muscle weakness (generalized)  Difficulty in walking, not elsewhere classified  Imbalance  ONSET DATE: Over previous year, worsening  balance/stability  FOLLOW UP APPT WITH PROVIDER: Yes ; f/u on 04/11/24   RATIONALE FOR EVALUATION AND TREATMENT: Rehabilitation  Pertinent History Pt is a 73 year old female referred for unsteadiness and weakness by her PCP. Hx of multiple falls. Pt was also referred to hematology to check hemoglobin. Pt is following up with hematology this Thursday. Pt reports having a lot of falls - "every few weeks here lately." Pt reports fear related to falling. Pt reports falling in street and having to ask neighbors for help to get up; she tripped on loose gravel (this happened 2 weeks ago). Patient reports no hx of neuropathy; pt reports her feet feel "strange" or feel as if she is dragging while she is walking. Pt reports some loss of appetite and weight loss. Patient reports some atrophy in her legs. Pt denies night pain/disturbed sleep. Pt reports some urinary frequency at night time.    Pain: No Numbness/Tingling: No Focal Weakness: Yes; weakness in legs specifically Recent changes in overall health/medication: Yes; pt cutting back on Metformin  dosage per PCP, discontinued cholesterol medication Prior history of physical therapy for balance:  No Falls: Has patient fallen in last 6 months? Yes, Number of falls: 10+ Directional pattern for falls: Yes, loses pattern in home more going backward  Imaging: No  Prior level of function: Independent with basic ADLs Occupational demands: Retired  Hobbies: Draw (pt doesn't have as much interest)  Red flags (bowel/bladder changes, saddle paresthesia, personal  history of cancer, h/o spinal tumors, h/o compression fx, h/o abdominal aneurysm, abdominal pain, chills/fever, night sweats, nausea, vomiting, unrelenting pain): Negative  Precautions: Fall history  Weight Bearing Restrictions: No  Living Environment Lives with: lives alone; daughter comes (she lives 45 mins away) Lives in: House/apartment; 6 steps to get into home, handrails on both sides (can  reach both); grab bar for tub. Grass walkway up to steps.  Has following equipment at home: Otho Blitz - 2 wheeled, quad cane, other cane that pt does not feel safe with     Patient Goals: "Get some stability"    OBJECTIVE:   Patient Surveys  ABC: 60%   Gross Musculoskeletal Assessment Tremor: None Bulk: Atrophy in R>L quads Tone: Normal R hip hump, L scapula sits higher than R   GAIT: Distance walked: 80 ft Assistive device utilized: None Level of assistance: CGA Comments: Shortened step/stride length bilaterally, decreased heel strike at initial contact, maintains forward flexed posture throughout gait cycle, decreased terminal knee extension with terminal swing, decreased arm swing and truncal rigidity  Posture: Forward flexed posture in sitting and standing    LE MMT:  MMT (out of 5) Right 02/28/2024 Left 02/28/2024  Hip flexion 4- 4-  Hip extension    Hip abduction (seated) 4 4  Hip adduction (seated)  5 5  Hip internal rotation    Hip external rotation    Knee flexion 4+ 4  Knee extension 4+ 4+  Ankle dorsiflexion 4- 4-  Ankle plantarflexion    Ankle inversion    Ankle eversion    (* = pain; Blank rows = not tested)  Coordination/Cerebellar Finger to Nose: WNL Heel to Shin: WNL Rapid alternating movements: Impaired UEs, WNL LE Finger Opposition: WNL Pronator Drift: Negative   Postural Control Romberg EO/EC WNL Sharpened Romberg: 7 sec   FUNCTIONAL OUTCOME MEASURES   Results Comments  BERG 42/56   DGI 12/24   TUG 24.8 seconds Fall risk, in need of intervention  5TSTS Unable to complete sit to stand in standard position Fall risk, in need of intervention  (Blank rows = not tested)    TODAY'S TREATMENT    Today's Vitals   03/27/24 1121 03/27/24 1128  BP: (!) 201/69 (!) 164/77  Pulse: 84   SpO2: 96%    There is no height or weight on file to calculate BMI.   SUBJECTIVE STATEMENT:  Patient reports some BP in 170s this AM. She reports no  diplopia, headache, or dizziness. Patient reports no recent falls or safety concerns at home.    Therapeutic Activity - improved strength as needed to improve performance of CKC activities/functional movements and as needed for power production to prevent fall during episode of large postural perturbation  NuStep; Level 4, x 5 minutes - for improved soft tissue mobility and increased tissue temperature to improve muscle performance   -subjective gathered during this time intermittently; 2 minutes not billed   Target stepping on blue agility ladder, for inc step length bilat; x4 D/B  Hurdle stepping with (4) 1x4 wooden boards and 1 long Airex (horizontal); step-through pattern; x3 D/B with unilateral UE support, x 3 D/B with no UE support  Dynamic march with opposite LE touch; 4x D/B // bars   Step up/down onto airex pad x 15 leading with each LE - single UE support   Lateral step up/down Airex pad; x10 over and back   PATIENT EDUCATION: Encouraged continued HEP and monitoring BP at home. Discussed following up with PCP  regarding medical management for BP  *next visit* Sit to stand with pt standing on Airex; Airex on seat to inc seat height also; 2 x 10  *not today* Toe tapping; 6-inch step with Airex on top for inc height; 2  x 10, alt Ambulate laps around gym with verbal cueing for heel strike at initial contact and longer stride lengths; x 3 laps Standing hip abduction 2 x 10 each LE  Sit to stand; reviewed for HEP Standing march, at counter; 2 x 10 alternating R/L Standing HR/TR; reviewed for HEP 3-way hip, in standing; 2 x 5 ea dir, bilat Hurdle stepping with 6-inch hurdles attempted; poor toe clearance with trail leg     PATIENT EDUCATION:  Education details: see above for patient education details Person educated: Patient Education method: Explanation and Demonstration Education comprehension: verbalized understanding and returned demonstration   HOME EXERCISE  PROGRAM: Access Code: [email or web address] URL: [email or web address] Date: 03/27/2024 Prepared by: Denese Finn  Exercises - Sit to Stand with Hands on Knees  - 2 x daily - 7 x weekly - 2 sets - 5-10 reps - Heel Toe Raises with Counter Support  - 2 x daily - 7 x weekly - 2 sets - 10 reps - Standing March with Counter Support  - 2 x daily - 7 x weekly - 2 sets - 10 reps - Standing 3-Way Kick  - 2 x daily - 7 x weekly - 2 sets - 5-6 reps - Alternating Step Taps with Counter Support  - 2 x daily - 7 x weekly - 2 sets - 10 reps   ASSESSMENT:  CLINICAL IMPRESSION:   Patient initially has BP contraindicating exercise, but it is in range not indicative of urgency or emergency with follow-up measurement. Pt has no S&S of hypertensive urgency. Pt was instructed on following up with PCP ASAP regarding BP management. We continued with LE strengthening and progression of balance training. Pt needs further work on balance, strengthening, and gait re-training. Patient will benefit from skilled PT to address above impairments and improve overall function.  REHAB POTENTIAL: Good  CLINICAL DECISION MAKING: Evolving/moderate complexity  EVALUATION COMPLEXITY: Moderate   GOALS: Goals reviewed with patient? No  SHORT TERM GOALS: Target date: 03/20/2024  Pt will be independent with HEP in order to improve strength and balance in order to decrease fall risk and improve function at home. Baseline: 02/28/24: Baseline HEP to be initiated on visit #2; discussed use of sit to stand (with UE assist prn) as exercise to reduce fall risk.  Goal status: INITIAL  Pt will perform independent sit to stand with no upper extremity support indicative of improved LE strength/power as needed to prevent fall and improved capacity for independent transferring Baseline: 02/28/24: Unable to complete sit to stand without UE assist.  Goal status: INITIAL  LONG TERM GOALS: Target date: 05/11/2024  Pt will negotiate 6  stairs with 1-2 handrail support safely with use of LRAD and no LOB as needed for entering/exiting home Baseline: 02/28/24: Has to use stairs for entering/exiting home, pt prone to tripping/falling.  Goal status: INITIAL  2.  Pt will improve BERG by at least 3 points in order to demonstrate clinically significant improvement in balance.   Baseline: 02/28/24: To be obtained on visit # 2.    03/01/24: 42/56 Goal status: INITIAL  3.  Pt will improve ABC by at least 13% in order to demonstrate clinically significant improvement in balance confidence.      Baseline: 02/28/24: 60% Goal  status: INITIAL  5. Pt will improve DGI by at least 3 points in order to demonstrate clinically significant improvement in balance and decreased risk for falls.     Baseline: 02/28/24: To be obtained on visit # 2.   03/01/24: 12/24 Goal status: INITIAL  6. Pt will decrease TUG to below 14 seconds/decrease in order to demonstrate decreased fall risk.  Baseline: 02/28/24: 24.8 sec  Goal status: INITIAL    PLAN: PT FREQUENCY: 2x/week  PT DURATION: 12 weeks  PLANNED INTERVENTIONS: Therapeutic exercises, Therapeutic activity, Neuromuscular re-education, Balance training, Gait training, Patient/Family education, Joint manipulation, Joint mobilization, Canalith repositioning, Aquatic Therapy, Dry Needling, Cognitive remediation, Electrical stimulation, Spinal manipulation, Spinal mobilization, Cryotherapy, Moist heat, Traction, Ultrasound, Ionotophoresis 4mg /ml Dexamethasone , and Manual therapy  PLAN FOR NEXT SESSION:  Continue with LE strengthening and balance training, weight shifting/toe tapping. Movement control training for step length/heel strike. Update HEP with successive PT visits.    Denese Finn, PT, DPT [phone] Physical Therapist - [facility]   Angela Ballard 03/27/2024, 12:46 PM

## 2024-03-29 ENCOUNTER — Ambulatory Visit: Admitting: Physical Therapy

## 2024-03-29 DIAGNOSIS — M6281 Muscle weakness (generalized): Secondary | ICD-10-CM | POA: Diagnosis not present

## 2024-03-29 DIAGNOSIS — R262 Difficulty in walking, not elsewhere classified: Secondary | ICD-10-CM

## 2024-03-29 DIAGNOSIS — R2689 Other abnormalities of gait and mobility: Secondary | ICD-10-CM | POA: Diagnosis not present

## 2024-03-29 NOTE — Therapy (Signed)
 OUTPATIENT PHYSICAL THERAPY TREATMENT AND PROGRESS NOTE   Dates of reporting period  02/28/24   to   03/29/24    Patient Name: Angela Ballard MRN: [id number] DOB:1951-02-24, 73 y.o., female Today's Date: 03/29/2024   PT End of Session - 03/29/24 1124     Visit Number 10    Number of Visits 21    Date for PT Re-Evaluation 05/08/24    Authorization Type [employer] 2025, VL based on auth    PT Start Time 1120    PT Stop Time 1205    PT Time Calculation (min) 45 min    Equipment Utilized During Treatment Gait belt    Activity Tolerance Patient tolerated treatment well    Behavior During Therapy WFL for tasks assessed/performed              Past Medical History:  Diagnosis Date   Diabetes mellitus without complication (HCC)    Family history of adverse reaction to anesthesia    GRANDFATHER-PASSED AWAY DURING SURGERY-UNSURE   Headache    H/O   Hyperlipidemia    Hypertension    Past Surgical History:  Procedure Laterality Date   APPENDECTOMY     PAROTIDECTOMY Left 10/05/2016   Procedure: PAROTIDECTOMY;  Surgeon: Mellody Sprout, MD;  Location: [facility] ORS;  Service: ENT;  Laterality: Left;   TONSILECTOMY, ADENOIDECTOMY, BILATERAL MYRINGOTOMY AND TUBES  child   TONSILLECTOMY     WISDOM TOOTH EXTRACTION  teenager   Patient Active Problem List   Diagnosis Date Noted   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   Neck mass 08/27/2016   Controlled type 2 diabetes mellitus without complication, without long-term current use of insulin  (HCC) 06/21/2015   Episodic low back pain 06/15/2014   Benign essential hypertension 03/09/2014   Pure hypercholesterolemia 03/09/2014   Type 2 diabetes mellitus, controlled (HCC) 03/09/2014    PCP: Lorrie Rothman, MD  REFERRING PROVIDER: Lorrie Rothman, MD  REFERRING DIAGNOSIS:  R26.81 (ICD-10-CM) - Unsteadiness on feet  R53.1 (ICD-10-CM) - Weakness    THERAPY DIAG: Muscle weakness (generalized)  Difficulty in walking, not elsewhere  classified  Imbalance  ONSET DATE: Over previous year, worsening balance/stability  FOLLOW UP APPT WITH PROVIDER: Yes ; f/u on 04/11/24   RATIONALE FOR EVALUATION AND TREATMENT: Rehabilitation  Pertinent History Pt is a 73 year old female referred for unsteadiness and weakness by her PCP. Hx of multiple falls. Pt was also referred to hematology to check hemoglobin. Pt is following up with hematology this Thursday. Pt reports having a lot of falls - "every few weeks here lately." Pt reports fear related to falling. Pt reports falling in street and having to ask neighbors for help to get up; she tripped on loose gravel (this happened 2 weeks ago). Patient reports no hx of neuropathy; pt reports her feet feel "strange" or feel as if she is dragging while she is walking. Pt reports some loss of appetite and weight loss. Patient reports some atrophy in her legs. Pt denies night pain/disturbed sleep. Pt reports some urinary frequency at night time.    Pain: No Numbness/Tingling: No Focal Weakness: Yes; weakness in legs specifically Recent changes in overall health/medication: Yes; pt cutting back on Metformin  dosage per PCP, discontinued cholesterol medication Prior history of physical therapy for balance:  No Falls: Has patient fallen in last 6 months? 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 Grass walkway up to steps.  Has following equipment at home: Otho Blitz - 2 wheeled, quad cane, other cane that pt does not feel safe with     Patient Goals: "Get some stability"    OBJECTIVE:   Patient Surveys  ABC: 60%   Gross Musculoskeletal Assessment Tremor: None Bulk: Atrophy in R>L quads Tone: Normal R hip hump, L scapula sits higher than R   GAIT: Distance walked: 80 ft Assistive device utilized: None Level of assistance: CGA Comments: Shortened step/stride length bilaterally, decreased heel strike at initial contact, maintains forward flexed posture throughout gait cycle, decreased terminal knee extension with terminal swing, decreased arm swing and truncal rigidity  Posture: Forward flexed posture in sitting and standing    LE MMT:  MMT (out of 5) Right 02/28/2024 Left 02/28/2024  Hip flexion 4- 4-  Hip extension    Hip abduction (seated) 4 4  Hip adduction (seated)  5 5  Hip internal rotation    Hip external rotation    Knee flexion 4+ 4  Knee extension 4+ 4+  Ankle dorsiflexion 4- 4-  Ankle plantarflexion    Ankle inversion    Ankle eversion    (* = pain; Blank rows = not tested)  Coordination/Cerebellar Finger to Nose: WNL Heel to Shin: WNL Rapid alternating movements: Impaired UEs, WNL LE Finger Opposition: WNL Pronator Drift: Negative   Postural Control Romberg EO/EC WNL Sharpened Romberg: 7 sec   FUNCTIONAL OUTCOME MEASURES   Results Comments  BERG 42/56 Fall risk, in need of intervention  DGI 12/24 Fall risk, in need of intervention  TUG 24.8 seconds Fall risk, in need of intervention  5TSTS Unable to complete sit to stand in standard position Fall risk, in need of intervention  (Blank rows = not tested)    TODAY'S TREATMENT     SUBJECTIVE STATEMENT:  Patient reports systolic BP in 160s this AM and no notable change. As low as 140  the other day. She reports good progress to date with PT; she states her daughter can tell a difference in her gait. Patient denies specific functional mobility/safety concern at this time. Pt reports being happy with her current progress. Pt feels that she will need to finish course of formal PT today.    *GOAL UPDATE PERFORMED      [facility] PT Assessment - 03/29/24 0001       Berg Balance Test   Sit to Stand Able to stand without using hands and stabilize independently    Standing Unsupported Able to stand safely 2 minutes    Sitting with Back Unsupported but Feet Supported on Floor or Stool Able to sit safely and securely 2 minutes    Stand to Sit Sits safely with minimal use of hands    Transfers Able to transfer safely, minor use of hands    Standing Unsupported with Eyes Closed Able to stand 10 seconds safely    Standing Unsupported with Feet Together Able to place feet together independently and stand  1 minute safely    From Standing, Reach Forward with Outstretched Arm Can reach forward >12 cm safely (5")    From Standing Position, Pick up Object from Floor Able to pick up shoe safely and easily    From Standing Position, Turn to Look Behind Over each Shoulder Looks behind from both sides and weight shifts well    Turn 360 Degrees Able to turn 360 degrees safely but slowly    Standing Unsupported, Alternately Place Feet on Step/Stool Able to stand independently and safely and complete 8 steps in 20 seconds    Standing Unsupported, One Foot in Front Able to take small step independently and hold 30 seconds    Standing on One Leg Able to lift leg independently and hold > 10 seconds    Total Score 51      Dynamic Gait Index   Level Surface Mild Impairment    Change in Gait Speed Mild Impairment    Gait with Horizontal Head Turns Moderate Impairment    Gait with Vertical Head Turns Moderate Impairment    Gait and Pivot Turn Normal    Step Over Obstacle Mild Impairment    Step Around  Obstacles Normal    Steps Moderate Impairment    Total Score 15              Therapeutic Activity - improved strength as needed to improve performance of CKC activities/functional movements and as needed for power production to prevent fall during episode of large postural perturbation  NuStep; Level 5, x 5 minutes - for improved soft tissue mobility and increased tissue temperature to improve muscle performance   -subjective gathered during this time intermittently; 2 minutes not billed    FUNCTIONAL OUTCOME MEASURE PERFORMANCE   -BERG, DGI, TUG, ABC Scale    PATIENT EDUCATION: Encouraged continuing with advanced HEP and following up with PT if she feels that her progress with HEP is not sufficient or she has further PT needs.    *next visit* Sit to stand with pt standing on Airex; Airex on seat to inc seat height also; 2 x 10  *not today* Toe tapping; 6-inch step with Airex on top for inc height; 2  x 10, alt Ambulate laps around gym with verbal cueing for heel strike at initial contact and longer stride lengths; x 3 laps Standing hip abduction 2 x 10 each LE  Sit to stand; reviewed for HEP Standing march, at counter; 2 x 10 alternating R/L Standing HR/TR; reviewed for HEP 3-way hip, in standing; 2 x 5 ea dir, bilat Hurdle stepping with 6-inch hurdles attempted; poor toe clearance with trail leg     PATIENT EDUCATION:  Education details: see above for patient education details Person educated: Patient Education method: Explanation and Demonstration Education comprehension: verbalized understanding and returned demonstration   HOME EXERCISE PROGRAM: Access Code: [email or web address] URL: [email or web address] Date: 03/29/2024 Prepared by: Denese Finn  Exercises - Sit to Stand with Hands on Knees  - 2 x daily - 7 x weekly - 2 sets - 5-10 reps - Heel Toe Raises with Counter Support  - 2 x daily - 7 x weekly - 2 sets - 10 reps - Standing March with Counter  Support  - 2 x daily - 7 x weekly - 2 sets - 10 reps - Standing 3-Way Kick  - 2 x daily - 7 x weekly - 2 sets - 5-6 reps - Alternating Step Taps with Counter Support  - 2 x daily -  7 x weekly - 2 sets - 10 reps - Forward Step with Kitchen Squares  - 1-2 x daily - 7 x weekly - 20 reps   ASSESSMENT:  CLINICAL IMPRESSION:   Patient has made excellent progress to date and has met PT goals. She does have DGI score short of fall risk cut-off, but has otherwise met cut-off scores (specific to fall risk) for all other outcome measures. She did achieve clinically meaningful improvement on DGI. Pt has high ABC score at this time, and she is happy with her current progress. She is able to demonstrate stair ascent and descent as needed for entering/exiting home. Advanced HEP was given for pt to continue; she was advised to follow up with PCP or PT if her condition regresses or if progress with HEP is suboptimal. Given pt needing to limit number of visits to preclude financial burden and good progress to date, pt is appropriate for discharge at this time.   REHAB POTENTIAL: Good  CLINICAL DECISION MAKING: Evolving/moderate complexity  EVALUATION COMPLEXITY: Moderate   GOALS: Goals reviewed with patient? No  SHORT TERM GOALS: Target date: 03/20/2024  Pt will be independent with HEP in order to improve strength and balance in order to decrease fall risk and improve function at home. Baseline: 02/28/24: Baseline HEP to be initiated on visit #2; discussed use of sit to stand (with UE assist prn) as exercise to reduce fall risk.        03/29/24: Pt reports working on at least some exercises every day.  Goal status: ACHIEVED   Pt will perform independent sit to stand with no upper extremity support indicative of improved LE strength/power as needed to prevent fall and improved capacity for independent transferring Baseline: 02/28/24: Unable to complete sit to stand without UE assist.       03/29/24: Performed  without UE suppot IND on first attempt.  Goal status: ACHIEVED  LONG TERM GOALS: Target date: 05/11/2024  Pt will negotiate 6 stairs with 1-2 handrail support safely with use of LRAD and no LOB as needed for entering/exiting home Baseline: 02/28/24: Has to use stairs for entering/exiting home, pt prone to tripping/falling.     03/29/24: Pt exhibits safe stair negotiation with self-selected step-to pattern for descent and reciprocal pattern for ascent with 1 handrail support.  Goal status: ACHIEVED  2.  Pt will improve BERG by at least 3 points in order to demonstrate clinically significant improvement in balance.   Baseline: 02/28/24: To be obtained on visit # 2.    03/01/24: 42/56     03/29/24: 51/56 Goal status: ACHIEVED  3.  Pt will improve ABC by at least 13% in order to demonstrate clinically significant improvement in balance confidence.      Baseline: 02/28/24: 60%     03/29/24: 96.25% Goal status: ACHIEVED  5. Pt will improve DGI by at least 3 points in order to demonstrate clinically significant improvement in balance and decreased risk for falls.     Baseline: 02/28/24: To be obtained on visit # 2.   03/01/24: 12/24     03/29/24: 15/24 Goal status: ACHIEVED   6. Pt will decrease TUG to below 14 seconds/decrease in order to demonstrate decreased fall risk.  Baseline: 02/28/24: 24.8 sec      03/29/24: 13.2 sec Goal status: ACHIEVED    PLAN: PT FREQUENCY: -  PT DURATION: -  PLANNED INTERVENTIONS: Therapeutic exercises, Therapeutic activity, Neuromuscular re-education, Balance training, Gait training, Patient/Family education, Joint manipulation, Joint mobilization, Canalith  repositioning, Aquatic Therapy, Dry Needling, Cognitive remediation, Electrical stimulation, Spinal manipulation, Spinal mobilization, Cryotherapy, Moist heat, Traction, Ultrasound, Ionotophoresis 4mg /ml Dexamethasone , and Manual therapy  PLAN FOR NEXT SESSION:  Continue with advanced HEP. Future f/u with PCP or PT if condition  regresses.     Denese Finn, PT, DPT [phone] Physical Therapist - [facility]   Aleatha Hunting 03/29/2024, 12:21 PM

## 2024-04-11 DIAGNOSIS — I10 Essential (primary) hypertension: Secondary | ICD-10-CM | POA: Diagnosis not present

## 2024-04-11 DIAGNOSIS — R531 Weakness: Secondary | ICD-10-CM | POA: Diagnosis not present

## 2024-04-11 DIAGNOSIS — Z1331 Encounter for screening for depression: Secondary | ICD-10-CM | POA: Diagnosis not present

## 2024-04-11 DIAGNOSIS — R634 Abnormal weight loss: Secondary | ICD-10-CM | POA: Diagnosis not present

## 2024-04-11 DIAGNOSIS — D582 Other hemoglobinopathies: Secondary | ICD-10-CM | POA: Diagnosis not present

## 2024-04-11 DIAGNOSIS — R2681 Unsteadiness on feet: Secondary | ICD-10-CM | POA: Diagnosis not present

## 2024-06-23 ENCOUNTER — Encounter: Payer: Self-pay | Admitting: Oncology

## 2024-06-23 ENCOUNTER — Inpatient Hospital Stay: Admitting: Oncology

## 2024-06-23 ENCOUNTER — Inpatient Hospital Stay: Attending: Oncology

## 2024-06-23 VITALS — BP 150/82 | HR 74 | Temp 96.4°F | Resp 19 | Ht 67.0 in | Wt 137.0 lb

## 2024-06-23 DIAGNOSIS — Z803 Family history of malignant neoplasm of breast: Secondary | ICD-10-CM | POA: Diagnosis not present

## 2024-06-23 DIAGNOSIS — F17210 Nicotine dependence, cigarettes, uncomplicated: Secondary | ICD-10-CM | POA: Diagnosis not present

## 2024-06-23 DIAGNOSIS — D751 Secondary polycythemia: Secondary | ICD-10-CM

## 2024-06-23 DIAGNOSIS — I10 Essential (primary) hypertension: Secondary | ICD-10-CM | POA: Insufficient documentation

## 2024-06-23 LAB — CBC WITH DIFFERENTIAL (CANCER CENTER ONLY)
Abs Immature Granulocytes: 0.03 K/uL (ref 0.00–0.07)
Basophils Absolute: 0.1 K/uL (ref 0.0–0.1)
Basophils Relative: 1 %
Eosinophils Absolute: 0.1 K/uL (ref 0.0–0.5)
Eosinophils Relative: 2 %
HCT: 53.4 % — ABNORMAL HIGH (ref 36.0–46.0)
Hemoglobin: 17.8 g/dL — ABNORMAL HIGH (ref 12.0–15.0)
Immature Granulocytes: 0 %
Lymphocytes Relative: 35 %
Lymphs Abs: 2.6 K/uL (ref 0.7–4.0)
MCH: 32.2 pg (ref 26.0–34.0)
MCHC: 33.3 g/dL (ref 30.0–36.0)
MCV: 96.6 fL (ref 80.0–100.0)
Monocytes Absolute: 0.7 K/uL (ref 0.1–1.0)
Monocytes Relative: 9 %
Neutro Abs: 3.8 K/uL (ref 1.7–7.7)
Neutrophils Relative %: 53 %
Platelet Count: 314 K/uL (ref 150–400)
RBC: 5.53 MIL/uL — ABNORMAL HIGH (ref 3.87–5.11)
RDW: 14.9 % (ref 11.5–15.5)
WBC Count: 7.3 K/uL (ref 4.0–10.5)
nRBC: 0 % (ref 0.0–0.2)

## 2024-06-23 NOTE — Progress Notes (Signed)
 [facility]  [phone] [phone]  ID: Angela Ballard OB: 1951/05/30  MR#: [id number]  RDW#:[id number]  Patient Care Team: Jeffie Cheryl FORBES, MD as PCP - General (Family Medicine) Jacobo Evalene PARAS, MD as Consulting Physician (Hematology and Oncology)  CHIEF COMPLAINT: Polycythemia.  INTERVAL HISTORY: Patient turns to clinic today for repeat laboratory and further evaluation.  She continues to smoke, but states she has cut back significantly.  She currently feels well and is asymptomatic. She has no neurologic complaints.  She denies any recent fevers or illnesses.  She has a good appetite and denies weight loss.  She has no chest pain, shortness of breath, cough, or hemoptysis.  She denies any nausea, vomiting, constipation, or diarrhea.  She has no urinary complaints.  Patient offers no specific complaints today.  REVIEW OF SYSTEMS:   Review of Systems  Constitutional: Negative.  Negative for fever, malaise/fatigue and weight loss.  Respiratory: Negative.  Negative for cough, hemoptysis and shortness of breath.   Cardiovascular: Negative.  Negative for chest pain and leg swelling.  Gastrointestinal: Negative.  Negative for abdominal pain.  Genitourinary: Negative.  Negative for dysuria.  Musculoskeletal: Negative.  Negative for back pain.  Skin: Negative.  Negative for rash.  Neurological: Negative.  Negative for dizziness, focal weakness, weakness and headaches.  Psychiatric/Behavioral: Negative.  The patient is not nervous/anxious.     As per HPI. Otherwise, a complete review of systems is negative.  PAST MEDICAL HISTORY: Past Medical History:  Diagnosis Date   Diabetes mellitus without complication (HCC)    Family history of adverse reaction to anesthesia    GRANDFATHER-PASSED AWAY DURING SURGERY-UNSURE   Headache    H/O   Hyperlipidemia    Hypertension     PAST SURGICAL HISTORY: Past Surgical History:  Procedure Laterality  Date   APPENDECTOMY     PAROTIDECTOMY Left 10/05/2016   Procedure: PAROTIDECTOMY;  Surgeon: Deward Argue, MD;  Location: [facility] ORS;  Service: ENT;  Laterality: Left;   TONSILECTOMY, ADENOIDECTOMY, BILATERAL MYRINGOTOMY AND TUBES  child   TONSILLECTOMY     WISDOM TOOTH EXTRACTION  teenager    FAMILY HISTORY: Family History  Problem Relation Age of Onset   Breast cancer Mother    Peripheral Artery Disease Sister    Epilepsy Brother     ADVANCED DIRECTIVES (Y/N):  N  HEALTH MAINTENANCE: Social History   Tobacco Use   Smoking status: Every Day    Current packs/day: 1.00    Average packs/day: 1 pack/day for 40.0 years (40.0 ttl pk-yrs)    Types: Cigarettes   Smokeless tobacco: Never  Substance Use Topics   Alcohol use: Yes    Comment: 2 beer daily   Drug use: No     Colonoscopy:  PAP:  Bone density:  Lipid panel:  Allergies  Allergen Reactions   Advil [Ibuprofen] Hives   Pravastatin Other (See Comments)    Leg cramps    Penicillins Rash    Has patient had a PCN reaction causing immediate rash, facial/tongue/throat swelling, SOB or lightheadedness with hypotension: No Has patient had a PCN reaction causing severe rash involving mucus membranes or skin necrosis: No Has patient had a PCN reaction that required hospitalization No Has patient had a PCN reaction occurring within the last 10 years: No If all of the above answers are NO, then may proceed with Cephalosporin use.     Current Outpatient Medications  Medication Sig Dispense Refill   Biotin 89999 MCG TABS Take 10,000  mcg by mouth daily.     Cholecalciferol 50 MCG (2000 UT) CAPS Take 2,000 Units by mouth daily.     CINNAMON PO Take 1,000 mg by mouth 2 (two) times daily.     Garlic 1000 MG CAPS Take 1,000 mg by mouth 2 (two) times daily.     ibuprofen (ADVIL,MOTRIN) 200 MG tablet Take 200-400 mg by mouth every 6 (six) hours as needed for headache or moderate pain.     lisinopril -hydrochlorothiazide   (PRINZIDE ,ZESTORETIC ) 10-12.5 MG tablet Take 1 tablet by mouth daily.      Magnesium  500 MG TABS Take 500 mg by mouth daily as needed (cramps).     metFORMIN  (GLUCOPHAGE ) 500 MG tablet Take 500 mg by mouth 2 (two) times daily.      Multiple Vitamin (MULTI-VITAMINS) TABS Take 1 tablet by mouth daily.      Omega-3 Fatty Acids (FISH OIL) 1200 MG CAPS Take 1,200 mg by mouth 2 (two) times daily.     OVER THE COUNTER MEDICATION Take 400 mg by mouth daily. Berberine 400mg  tab     Red Yeast Rice 600 MG TABS Take 600 mg by mouth daily.     TURMERIC PO Take 800 mg by mouth daily.     Zinc  50 MG TABS Take 50 mg by mouth daily.     No current facility-administered medications for this visit.    OBJECTIVE: Vitals:   06/23/24 1047 06/23/24 1055  BP: (!) 195/90 (!) 150/82  Pulse: 74   Resp: 19   Temp: (!) 96.4 F (35.8 C)   SpO2: 98%      Body mass index is 21.46 kg/m.    ECOG FS:0 - Asymptomatic  General: Well-developed, well-nourished, no acute distress.  Sitting in a wheelchair. Eyes: Pink conjunctiva, anicteric sclera. HEENT: Normocephalic, moist mucous membranes. Lungs: No audible wheezing or coughing. Heart: Regular rate and rhythm. Abdomen: Soft, nontender, no obvious distention. Musculoskeletal: No edema, cyanosis, or clubbing. Neuro: Alert, answering all questions appropriately. Cranial nerves grossly intact. Skin: No rashes or petechiae noted. Psych: Normal affect.  LAB RESULTS:  Lab Results  Component Value Date   NA 129 (L) 08/27/2016   K 4.1 08/27/2016   CL 95 (L) 08/27/2016   CO2 24 08/27/2016   GLUCOSE 147 (H) 08/27/2016   BUN 10 08/27/2016   CREATININE 0.67 08/27/2016   CALCIUM 9.6 08/27/2016   GFRNONAA >60 08/27/2016   GFRAA >60 08/27/2016    Lab Results  Component Value Date   WBC 7.3 06/23/2024   NEUTROABS 3.8 06/23/2024   HGB 17.8 (H) 06/23/2024   HCT 53.4 (H) 06/23/2024   MCV 96.6 06/23/2024   PLT 314 06/23/2024   Lab Results  Component Value Date    IRON 65 03/02/2024   TIBC 504 (H) 03/02/2024   IRONPCTSAT 13 03/02/2024   Lab Results  Component Value Date   FERRITIN 12 03/02/2024     STUDIES: No results found.  ASSESSMENT: Polycythemia.  PLAN:    Polycythemia: Likely secondary to tobacco use given her elevated carbon monoxide levels.  Previously, of her laboratory work including iron stores and JAK-2 mutation with reflex were either negative or within normal limits.  No intervention is needed at this time.  Patient does not require phlebotomy.  After discussion with the patient, is agreed upon that no further follow-up is necessary.  Recommend monitoring hemoglobin 2-3 times per year and if it trends up and is greater than 19.0, please refer patient back for further evaluation and  consideration of phlebotomy.   Hypertension: Chronic and unchanged.  Continue monitoring and treatment per primary care. Smoking cessation: Patient continues to smoke, but reports she has cut back significantly.  She expressed understanding that discontinuing smoking will likely help resolve her underlying polycythemia.  Patient expressed understanding and was in agreement with this plan. She also understands that She can call clinic at any time with any questions, concerns, or complaints.    Evalene JINNY Reusing, MD   06/23/2024 11:13 AM

## 2024-06-23 NOTE — Progress Notes (Signed)
 Patients BP was more elevated than it normally is, due to her being in transition to a different type of BP medication. Her provider told her to take the rest of the one BP medication and then she is to start on the new BP medication.

## 2024-07-03 DIAGNOSIS — R809 Proteinuria, unspecified: Secondary | ICD-10-CM | POA: Diagnosis not present

## 2024-07-03 DIAGNOSIS — E1129 Type 2 diabetes mellitus with other diabetic kidney complication: Secondary | ICD-10-CM | POA: Diagnosis not present

## 2024-07-03 DIAGNOSIS — E7800 Pure hypercholesterolemia, unspecified: Secondary | ICD-10-CM | POA: Diagnosis not present

## 2024-07-10 DIAGNOSIS — R809 Proteinuria, unspecified: Secondary | ICD-10-CM | POA: Diagnosis not present

## 2024-07-10 DIAGNOSIS — E7800 Pure hypercholesterolemia, unspecified: Secondary | ICD-10-CM | POA: Diagnosis not present

## 2024-07-10 DIAGNOSIS — N3941 Urge incontinence: Secondary | ICD-10-CM | POA: Diagnosis not present

## 2024-07-10 DIAGNOSIS — R634 Abnormal weight loss: Secondary | ICD-10-CM | POA: Diagnosis not present

## 2024-07-10 DIAGNOSIS — M791 Myalgia, unspecified site: Secondary | ICD-10-CM | POA: Diagnosis not present

## 2024-07-10 DIAGNOSIS — F17210 Nicotine dependence, cigarettes, uncomplicated: Secondary | ICD-10-CM | POA: Diagnosis not present

## 2024-07-10 DIAGNOSIS — Z1331 Encounter for screening for depression: Secondary | ICD-10-CM | POA: Diagnosis not present

## 2024-07-10 DIAGNOSIS — E119 Type 2 diabetes mellitus without complications: Secondary | ICD-10-CM | POA: Diagnosis not present

## 2024-07-10 DIAGNOSIS — I10 Essential (primary) hypertension: Secondary | ICD-10-CM | POA: Diagnosis not present

## 2024-10-13 NOTE — Progress Notes (Addendum)
 Angela Ballard                                          MRN: [id number]   10/13/2024   The [facility] Quality Team Specialist reviewed this patient medical record for the purposes of chart review for care gap closure. The following were reviewed: chart review for care gap closure-controlling blood pressure. Abstracted GSD  11/14/2024- NO CBP TO CLOSE 2025   [facility] Quality Team
